# Patient Record
Sex: Female | Born: 1970 | Race: Black or African American | Hispanic: No | Smoking: Never smoker
Health system: Southern US, Community
[De-identification: ages and names within clinical notes are randomized; demographics above are authoritative.]

## PROBLEM LIST (undated history)

## (undated) DIAGNOSIS — R920 Mammographic microcalcification found on diagnostic imaging of breast: Secondary | ICD-10-CM

## (undated) DIAGNOSIS — K638219 Small intestinal bacterial overgrowth, unspecified: Secondary | ICD-10-CM

## (undated) HISTORY — DX: Small intestinal bacterial overgrowth, unspecified: K63.8219

## (undated) HISTORY — DX: Mammographic microcalcification found on diagnostic imaging of breast: R92.0

## (undated) HISTORY — PX: NO PAST SURGERIES: SHX2092

---

## 2009-09-12 LAB — HM PAP SMEAR

## 2010-08-17 ENCOUNTER — Ambulatory Visit: Payer: Self-pay

## 2011-07-09 HISTORY — PX: BREAST BIOPSY: SHX20

## 2012-04-23 ENCOUNTER — Ambulatory Visit: Payer: Self-pay

## 2012-04-30 ENCOUNTER — Ambulatory Visit: Payer: Self-pay

## 2012-05-18 ENCOUNTER — Ambulatory Visit: Payer: Self-pay | Admitting: General Surgery

## 2012-05-19 LAB — PATHOLOGY REPORT

## 2012-10-05 ENCOUNTER — Encounter: Payer: Self-pay | Admitting: General Surgery

## 2012-11-04 ENCOUNTER — Encounter: Payer: Self-pay | Admitting: *Deleted

## 2012-11-17 ENCOUNTER — Ambulatory Visit: Payer: Self-pay | Admitting: General Surgery

## 2012-11-18 ENCOUNTER — Encounter: Payer: Self-pay | Admitting: General Surgery

## 2012-12-08 ENCOUNTER — Encounter: Payer: Self-pay | Admitting: General Surgery

## 2012-12-08 ENCOUNTER — Ambulatory Visit (INDEPENDENT_AMBULATORY_CARE_PROVIDER_SITE_OTHER): Payer: 59 | Admitting: General Surgery

## 2012-12-08 VITALS — BP 120/80 | HR 78 | Resp 12 | Ht 65.0 in | Wt 197.0 lb

## 2012-12-08 DIAGNOSIS — Z803 Family history of malignant neoplasm of breast: Secondary | ICD-10-CM

## 2012-12-08 DIAGNOSIS — Z1239 Encounter for other screening for malignant neoplasm of breast: Secondary | ICD-10-CM

## 2012-12-08 NOTE — Patient Instructions (Addendum)
Return in 5 months.

## 2012-12-08 NOTE — Progress Notes (Signed)
Patient ID: Jenna Fuller, female   DOB: 03/06/1971, 42 y.o.   MRN: [id number]  Chief Complaint  Patient presents with  . Other    mammo    HPI Jenna Fuller is a 42 y.o. female here today with her follow up left mammogram done on 11-17-12 cat 2.In November 2013 she had stereo biopsy of the left breast for micro calcifications-benign pathology.  Patient states no new breast problems.Mother had breast ca at 67.  HPI  Past Medical History  Diagnosis Date  . Mammographic microcalcification     History reviewed. No pertinent past surgical history.  Family History  Problem Relation Age of Onset  . Breast cancer Mother 25    Social History History  Substance Use Topics  . Smoking status: Never Smoker   . Smokeless tobacco: Never Used  . Alcohol Use: No    No Known Allergies  No current outpatient prescriptions on file.   No current facility-administered medications for this visit.    Review of Systems Review of Systems  Constitutional: Negative.   Respiratory: Negative.   Cardiovascular: Negative.     Blood pressure 120/80, pulse 78, resp. rate 12, height 5\' 5"  (1.651 m), weight 197 lb (89.359 kg), last menstrual period 12/03/2012.  Physical Exam Physical Exam  Vitals reviewed. Constitutional: She appears well-developed and well-nourished.  Neck: Neck supple.  Pulmonary/Chest: Right breast exhibits no inverted nipple, no mass, no nipple discharge, no skin change and no tenderness. Left breast exhibits no inverted nipple, no mass, no nipple discharge, no skin change and no tenderness. Breasts are symmetrical.  Lymphadenopathy:    She has no cervical adenopathy.    She has no axillary adenopathy.    Data Reviewed Mammogram reviewed.  Assessment    Stable exam post stereo biopsy left side    Plan   Bil mammogram in 6 mos        SANKAR,SEEPLAPUTHUR G 12/10/2012, 8:15 AM

## 2012-12-10 ENCOUNTER — Encounter: Payer: Self-pay | Admitting: General Surgery

## 2012-12-10 DIAGNOSIS — Z1239 Encounter for other screening for malignant neoplasm of breast: Secondary | ICD-10-CM | POA: Insufficient documentation

## 2012-12-10 DIAGNOSIS — Z803 Family history of malignant neoplasm of breast: Secondary | ICD-10-CM | POA: Insufficient documentation

## 2013-05-13 ENCOUNTER — Ambulatory Visit: Payer: Self-pay | Admitting: General Surgery

## 2013-05-17 ENCOUNTER — Encounter: Payer: Self-pay | Admitting: General Surgery

## 2013-06-02 ENCOUNTER — Ambulatory Visit: Payer: 59 | Admitting: General Surgery

## 2013-07-14 ENCOUNTER — Encounter: Payer: Self-pay | Admitting: *Deleted

## 2014-04-27 LAB — HM PAP SMEAR
HM Pap smear: NEGATIVE
HM Pap smear: NEGATIVE
HM Pap smear: NEGATIVE

## 2014-05-09 ENCOUNTER — Encounter: Payer: Self-pay | Admitting: General Surgery

## 2014-05-30 ENCOUNTER — Ambulatory Visit: Payer: Self-pay

## 2014-05-30 LAB — HM MAMMOGRAPHY

## 2015-05-03 ENCOUNTER — Other Ambulatory Visit: Payer: Self-pay | Admitting: Obstetrics and Gynecology

## 2015-05-03 DIAGNOSIS — Z1231 Encounter for screening mammogram for malignant neoplasm of breast: Secondary | ICD-10-CM

## 2015-06-05 ENCOUNTER — Ambulatory Visit
Admission: RE | Admit: 2015-06-05 | Discharge: 2015-06-05 | Disposition: A | Discharge status: Home / Self Care | Payer: 59 | Source: Ambulatory Visit | Attending: Obstetrics and Gynecology | Admitting: Obstetrics and Gynecology

## 2015-06-05 DIAGNOSIS — Z1231 Encounter for screening mammogram for malignant neoplasm of breast: Secondary | ICD-10-CM | POA: Diagnosis present

## 2015-06-05 DIAGNOSIS — Z803 Family history of malignant neoplasm of breast: Secondary | ICD-10-CM | POA: Diagnosis not present

## 2015-07-26 ENCOUNTER — Encounter: Payer: Self-pay | Admitting: Unknown Physician Specialty

## 2015-07-26 ENCOUNTER — Other Ambulatory Visit: Payer: Self-pay | Admitting: Unknown Physician Specialty

## 2015-07-26 ENCOUNTER — Ambulatory Visit (INDEPENDENT_AMBULATORY_CARE_PROVIDER_SITE_OTHER): Payer: 59 | Admitting: Unknown Physician Specialty

## 2015-07-26 VITALS — BP 127/79 | HR 83 | Temp 98.2°F | Ht 66.0 in | Wt 197.0 lb

## 2015-07-26 DIAGNOSIS — N951 Menopausal and female climacteric states: Secondary | ICD-10-CM | POA: Diagnosis not present

## 2015-07-26 DIAGNOSIS — J309 Allergic rhinitis, unspecified: Secondary | ICD-10-CM

## 2015-07-26 DIAGNOSIS — D649 Anemia, unspecified: Secondary | ICD-10-CM | POA: Diagnosis not present

## 2015-07-26 DIAGNOSIS — Z780 Asymptomatic menopausal state: Secondary | ICD-10-CM | POA: Diagnosis not present

## 2015-07-26 DIAGNOSIS — N912 Amenorrhea, unspecified: Secondary | ICD-10-CM

## 2015-07-26 MED ORDER — PAROXETINE HCL 10 MG PO TABS: 10.0000 mg | ORAL_TABLET | Freq: Every day | ORAL | Status: DC

## 2015-07-26 NOTE — Assessment & Plan Note (Signed)
Hot flashes that appear to be due to menopause despite young age.  Check FSH and LH.  Start Paxil 5 mg as high risk for hormone therapy with mother's history of breast cancer.

## 2015-07-26 NOTE — Addendum Note (Signed)
Addended by: Gabriel Cirri on: 07/26/2015 02:39 PM   Modules accepted: Orders, SmartSet

## 2015-07-26 NOTE — Progress Notes (Signed)
BP 127/79 mmHg  Pulse 83  Temp(Src) 98.2 F (36.8 C)  Ht  (1.676 m)  Wt 197 lb (89.359 kg)  BMI 31.81 kg/m2  SpO2 98%  LMP 04/18/2015 (Approximate)   Subjective:    Patient ID: Jenna Fuller, female    DOB: Mar 13, 1971, 45 y.o.   MRN: [id number]  HPI: Jenna Fuller is a 45 y.o. female  Chief Complaint  Patient presents with  . Annual Exam    pt states she would like to talk about menstrual cycle. States she has not had one since 04/2015 and started having hot flashes   She states she went to her CNM and did not get a pap smear due to 3 year cycle but did get lab work done.  CC is no menses since October.  She is having hot flashes and complaining of hourly flashes that last about 2 minutes.  No chance she is pregnant.    Family history significant for breast cancer in mother and aunt  Labs reviewed from gyn and all normal besides mild anemia.  TSH is 1.5  Relevant past medical, surgical, family and social history reviewed and updated as indicated. Interim medical history since our last visit reviewed. Allergies and medications reviewed and updated.  Review of Systems  Per HPI unless specifically indicated above     Objective:    BP 127/79 mmHg  Pulse 83  Temp(Src) 98.2 F (36.8 C)  Ht  (1.676 m)  Wt 197 lb (89.359 kg)  BMI 31.81 kg/m2  SpO2 98%  LMP 04/18/2015 (Approximate)  Wt Readings from Last 3 Encounters:  07/26/15 197 lb (89.359 kg)  12/08/12 197 lb (89.359 kg)    Physical Exam  Constitutional: She is oriented to person, place, and time. She appears well-developed and well-nourished. No distress.  HENT:  Head: Normocephalic and atraumatic.  Eyes: Conjunctivae and lids are normal. Right eye exhibits no discharge. Left eye exhibits no discharge. No scleral icterus.  Neck: Normal range of motion. Neck supple. No JVD present. Carotid bruit is not present.  Cardiovascular: Normal rate, regular rhythm and normal heart sounds.    Pulmonary/Chest: Effort normal and breath sounds normal.  Abdominal: Normal appearance. There is no splenomegaly or hepatomegaly.  Musculoskeletal: Normal range of motion.  Neurological: She is alert and oriented to person, place, and time.  Skin: Skin is warm, dry and intact. No rash noted. No pallor.  Psychiatric: She has a normal mood and affect. Her behavior is normal. Judgment and thought content normal.    Results for orders placed or performed in visit on 07/26/15  HM MAMMOGRAPHY  Result Value Ref Range   HM Mammogram from PP   HM PAP SMEAR  Result Value Ref Range   HM Pap smear from PP       Assessment & Plan:   Problem List Items Addressed This Visit      Unprioritized   Menopause - Primary   Hot flashes, menopausal    Hot flashes that appear to be due to menopause despite young age.  Check FSH and LH.  Start Paxil 5 mg as high risk for hormone therapy with mother's history of breast cancer.         Other Visit Diagnoses    Amenorrhea        Relevant Orders    FSH    LH    Prolactin        Follow up plan: Return in about 6 weeks (  around 09/06/2015).

## 2015-07-27 ENCOUNTER — Encounter: Payer: Self-pay | Admitting: Unknown Physician Specialty

## 2015-07-27 LAB — CBC WITH DIFFERENTIAL/PLATELET
Basophils Absolute: 0 10*3/uL (ref 0.0–0.2)
Basos: 0 %
EOS (ABSOLUTE): 0.2 10*3/uL (ref 0.0–0.4)
Eos: 4 %
Hematocrit: 38.1 % (ref 34.0–46.6)
Hemoglobin: 12 g/dL (ref 11.1–15.9)
Immature Grans (Abs): 0 10*3/uL (ref 0.0–0.1)
Immature Granulocytes: 0 %
Lymphocytes Absolute: 1.4 10*3/uL (ref 0.7–3.1)
Lymphs: 30 %
MCH: 23 pg — ABNORMAL LOW (ref 26.6–33.0)
MCHC: 31.5 g/dL (ref 31.5–35.7)
MCV: 73 fL — ABNORMAL LOW (ref 79–97)
Monocytes Absolute: 0.6 10*3/uL (ref 0.1–0.9)
Monocytes: 13 %
Neutrophils Absolute: 2.4 10*3/uL (ref 1.4–7.0)
Neutrophils: 53 %
Platelets: 274 10*3/uL (ref 150–379)
RBC: 5.21 x10E6/uL (ref 3.77–5.28)
RDW: 20.8 % — ABNORMAL HIGH (ref 12.3–15.4)
WBC: 4.6 10*3/uL (ref 3.4–10.8)

## 2015-07-27 LAB — PROLACTIN: Prolactin: 8.9 ng/mL (ref 4.8–23.3)

## 2015-07-27 LAB — FOLLICLE STIMULATING HORMONE: FSH: 38.5 m[IU]/mL

## 2015-07-27 LAB — LUTEINIZING HORMONE: LH: 26.3 m[IU]/mL

## 2015-09-06 ENCOUNTER — Encounter: Payer: Self-pay | Admitting: Unknown Physician Specialty

## 2015-09-06 ENCOUNTER — Ambulatory Visit (INDEPENDENT_AMBULATORY_CARE_PROVIDER_SITE_OTHER): Payer: 59 | Admitting: Unknown Physician Specialty

## 2015-09-06 VITALS — BP 127/75 | HR 80 | Temp 98.1°F | Ht 65.8 in | Wt 204.8 lb

## 2015-09-06 DIAGNOSIS — N951 Menopausal and female climacteric states: Secondary | ICD-10-CM

## 2015-09-06 NOTE — Progress Notes (Signed)
BP 127/75 mmHg  Pulse 80  Temp(Src) 98.1 F (36.7 C)  Ht 5' 5.8" (1.671 m)  Wt 204 lb 12.8 oz (92.897 kg)  BMI 33.27 kg/m2  SpO2 99%  LMP 04/19/2015 (Approximate)   Subjective:    Patient ID: Jenna Fuller, female    DOB: 08-11-1970, 45 y.o.   MRN: [id number]  HPI: Jenna Fuller is a 45 y.o. female  Chief Complaint  Patient presents with  . Hot Flashes    pt states she is still having hot flashes but states they are different. States they stopped for about 3 weeks and when they came back, they were different.    Hot flashes: Patient hasn't noticed improvement in symptoms on Paxil. States pills are small as it is and very difficult to break in half in order to get  dose. Flashes actually stopped about a week before she was last here in January and did not return until 3 weeks later. Flashes feeling differently now and patient "just suddenly feels hot" whereas before, the flashes "would start in her chest and progress up neck to face." Flashes are disruptive to sleep at night and waking up 2-4 times every night.   Relevant past medical, surgical, family and social history reviewed and updated as indicated. Interim medical history since our last visit reviewed. Allergies and medications reviewed and updated.  Review of Systems  Constitutional: Negative.   HENT: Negative.   Eyes: Negative.   Respiratory: Negative.   Cardiovascular: Negative.   Gastrointestinal: Negative.   Endocrine: Negative.        Hot flashes  Genitourinary: Negative.   Musculoskeletal: Negative.   Skin: Negative.   Allergic/Immunologic: Negative.   Neurological: Negative.   Hematological: Negative.   Psychiatric/Behavioral: Negative.     Per HPI unless specifically indicated above     Objective:    BP 127/75 mmHg  Pulse 80  Temp(Src) 98.1 F (36.7 C)  Ht 5' 5.8" (1.671 m)  Wt 204 lb 12.8 oz (92.897 kg)  BMI 33.27 kg/m2  SpO2 99%  LMP 04/19/2015 (Approximate)  Wt  Readings from Last 3 Encounters:  09/06/15 204 lb 12.8 oz (92.897 kg)  07/26/15 197 lb (89.359 kg)  12/08/12 197 lb (89.359 kg)    Physical Exam  Constitutional: She is oriented to person, place, and time. She appears well-developed and well-nourished. No distress.  HENT:  Head: Normocephalic and atraumatic.  Eyes: Conjunctivae and lids are normal. Right eye exhibits no discharge. Left eye exhibits no discharge. No scleral icterus.  Cardiovascular: Normal rate.   Pulmonary/Chest: Effort normal.  Abdominal: Normal appearance. There is no splenomegaly or hepatomegaly.  Musculoskeletal: Normal range of motion.  Neurological: She is alert and oriented to person, place, and time.  Skin: Skin is intact. No rash noted. No pallor.  Psychiatric: She has a normal mood and affect. Her behavior is normal. Judgment and thought content normal.    Results for orders placed or performed in visit on 07/26/15  [facility]  Result Value Ref Range   FSH 38.5 mIU/mL  LH  Result Value Ref Range   LH 26.3 mIU/mL  Prolactin  Result Value Ref Range   Prolactin 8.9 4.8 - 23.3 ng/mL  CBC with Differential/Platelet  Result Value Ref Range   WBC 4.6 3.4 - 10.8 x10E3/uL   RBC 5.21 3.77 - 5.28 x10E6/uL   Hemoglobin 12.0 11.1 - 15.9 g/dL   Hematocrit 40.9 81.1 - 46.6 %   MCV 73 (L) 79 - 97  fL   MCH 23.0 (L) 26.6 - 33.0 pg   MCHC 31.5 31.5 - 35.7 g/dL   RDW 45.4 (H) 09.8 - 11.9 %   Platelets 274 150 - 379 x10E3/uL   Neutrophils 53 %   Lymphs 30 %   Monocytes 13 %   Eos 4 %   Basos 0 %   Neutrophils Absolute 2.4 1.4 - 7.0 x10E3/uL   Lymphocytes Absolute 1.4 0.7 - 3.1 x10E3/uL   Monocytes Absolute 0.6 0.1 - 0.9 x10E3/uL   EOS (ABSOLUTE) 0.2 0.0 - 0.4 x10E3/uL   Basophils Absolute 0.0 0.0 - 0.2 x10E3/uL   Immature Granulocytes 0 %   Immature Grans (Abs) 0.0 0.0 - 0.1 x10E3/uL  CBC with Differential/Platelet  Result Value Ref Range   WBC CANCELED x10E3/uL   RBC CANCELED    Hemoglobin CANCELED     Hematocrit CANCELED    Platelets CANCELED    Neutrophils CANCELED    Lymphs CANCELED    Monocytes CANCELED    Eos CANCELED    Lymphocytes Absolute CANCELED    EOS (ABSOLUTE) CANCELED    Basophils Absolute CANCELED       Assessment & Plan:   Problem List Items Addressed This Visit      Unprioritized   Hot flashes, menopausal - Primary    No improvement in flashes on Paxil . Increase to Paxil .           Follow up plan: Return in about 6 weeks (around 10/18/2015).

## 2015-09-06 NOTE — Assessment & Plan Note (Signed)
No improvement in flashes on Paxil . Increase to Paxil .

## 2015-09-14 ENCOUNTER — Telehealth: Payer: Self-pay | Admitting: Unknown Physician Specialty

## 2015-09-14 MED ORDER — CITALOPRAM HYDROBROMIDE 20 MG PO TABS: 20.0000 mg | ORAL_TABLET | Freq: Every day | ORAL | Status: DC

## 2015-09-14 NOTE — Telephone Encounter (Signed)
Routing to provider. Can we send in something different? She was just seen 09/06/15.

## 2015-09-14 NOTE — Telephone Encounter (Signed)
We can change to Citalopram.

## 2015-09-14 NOTE — Telephone Encounter (Signed)
Pt called and she is has been experiencing numbness in her legs since she started taking PARoxetine (PAXIL) 10 MG tablet.

## 2015-09-14 NOTE — Telephone Encounter (Signed)
Left message on patients voicemail about the medication being switched. Told her to call us with any questions.

## 2015-09-21 ENCOUNTER — Telehealth: Payer: Self-pay | Admitting: Unknown Physician Specialty

## 2015-09-21 NOTE — Telephone Encounter (Signed)
Pt still having numbness in R leg at night and now is experiencing tingling during the day.  Medicine is not helping, would like someone to call her as to what to do next.

## 2015-09-22 NOTE — Telephone Encounter (Signed)
Routing to provider. Patient was on paxil and Elnita MaxwellCheryl switched her to citalopram because patient thought that paxil was possibly causing the numbness. Should we have the patient come in for an appointment with Elnita Maxwellheryl? What can I advise her to do over the weekend?

## 2015-09-22 NOTE — Telephone Encounter (Signed)
Yes, please. Let's get her into see Elnita MaxwellCheryl next week.

## 2015-09-22 NOTE — Telephone Encounter (Signed)
Called and scheduled patient an appointment for 09/27/15.

## 2015-09-27 ENCOUNTER — Ambulatory Visit (INDEPENDENT_AMBULATORY_CARE_PROVIDER_SITE_OTHER): Payer: 59 | Admitting: Unknown Physician Specialty

## 2015-09-27 ENCOUNTER — Encounter: Payer: Self-pay | Admitting: Unknown Physician Specialty

## 2015-09-27 VITALS — BP 129/80 | HR 68 | Temp 97.9°F | Ht 65.5 in | Wt 206.2 lb

## 2015-09-27 DIAGNOSIS — G5711 Meralgia paresthetica, right lower limb: Secondary | ICD-10-CM | POA: Insufficient documentation

## 2015-09-27 DIAGNOSIS — D509 Iron deficiency anemia, unspecified: Secondary | ICD-10-CM

## 2015-09-27 NOTE — Progress Notes (Signed)
BP 129/80 mmHg  Pulse 68  Temp(Src) 97.9 F (36.6 C)  Ht 5' 5.5" (1.664 m)  Wt 206 lb 3.2 oz (93.532 kg)  BMI 33.78 kg/m2  SpO2 100%  LMP 04/19/2015 (Approximate)   Subjective:    Patient ID: Jenna Fuller, female    DOB: 1970/08/14, 45 y.o.   MRN: [id number]  HPI: [name] is a 45 y.o. female  Chief Complaint  Patient presents with  . Numbness    pt states she has been having numbness and tingling in right thigh, thought it may have been from a medication she started (paxil) but she stopped taking that and she is still having the numbness and tingling   Pt is taking the Citalopram.  She is hot flash free but started noting the above symptoms.  States it is there when lying on her back, crossing her legs, or lying on right side but not on left side.  She states it is more like a lack of feeling than pins and needles and more of a heavy feeling.  No problems moving legs.    Relevant past medical, surgical, family and social history reviewed and updated as indicated. Interim medical history since our last visit reviewed. Allergies and medications reviewed and updated.  Review of Systems  All other systems reviewed and are negative.   Per HPI unless specifically indicated above     Objective:    BP 129/80 mmHg  Pulse 68  Temp(Src) 97.9 F (36.6 C)  Ht 5' 5.5" (1.664 m)  Wt 206 lb 3.2 oz (93.532 kg)  BMI 33.78 kg/m2  SpO2 100%  LMP 04/19/2015 (Approximate)  Wt Readings from Last 3 Encounters:  09/27/15 206 lb 3.2 oz (93.532 kg)  09/06/15 204 lb 12.8 oz (92.897 kg)  07/26/15 197 lb (89.359 kg)    Physical Exam  Constitutional: She is oriented to person, place, and time. She appears well-developed and well-nourished. No distress.  HENT:  Head: Normocephalic and atraumatic.  Eyes: Conjunctivae and lids are normal. Right eye exhibits no discharge. Left eye exhibits no discharge. No scleral icterus.  Cardiovascular: Normal rate.    Pulmonary/Chest: Effort normal.  Abdominal: Normal appearance. There is no splenomegaly or hepatomegaly.  Musculoskeletal: Normal range of motion. She exhibits no edema or tenderness.       Right upper leg: She exhibits no tenderness, no bony tenderness, no swelling, no edema, no deformity and no laceration.  Sensation intact.    Neurological: She is alert and oriented to person, place, and time.  Skin: Skin is intact. No rash noted. No pallor.  Psychiatric: She has a normal mood and affect. Her behavior is normal. Judgment and thought content normal.   Noted microcytosis below  Results for orders placed or performed in visit on 07/26/15  [facility]  Result Value Ref Range   FSH 38.5 mIU/mL  LH  Result Value Ref Range   LH 26.3 mIU/mL  Prolactin  Result Value Ref Range   Prolactin 8.9 4.8 - 23.3 ng/mL  CBC with Differential/Platelet  Result Value Ref Range   WBC 4.6 3.4 - 10.8 x10E3/uL   RBC 5.21 3.77 - 5.28 x10E6/uL   Hemoglobin 12.0 11.1 - 15.9 g/dL   Hematocrit 40.9 81.1 - 46.6 %   MCV 73 (L) 79 - 97 fL   MCH 23.0 (L) 26.6 - 33.0 pg   MCHC 31.5 31.5 - 35.7 g/dL   RDW 91.4 (H) 78.2 - 95.6 %   Platelets 274 150 -  379 x10E3/uL   Neutrophils 53 %   Lymphs 30 %   Monocytes 13 %   Eos 4 %   Basos 0 %   Neutrophils Absolute 2.4 1.4 - 7.0 x10E3/uL   Lymphocytes Absolute 1.4 0.7 - 3.1 x10E3/uL   Monocytes Absolute 0.6 0.1 - 0.9 x10E3/uL   EOS (ABSOLUTE) 0.2 0.0 - 0.4 x10E3/uL   Basophils Absolute 0.0 0.0 - 0.2 x10E3/uL   Immature Granulocytes 0 %   Immature Grans (Abs) 0.0 0.0 - 0.1 x10E3/uL  CBC with Differential/Platelet  Result Value Ref Range   WBC CANCELED x10E3/uL   RBC CANCELED    Hemoglobin CANCELED    Hematocrit CANCELED    Platelets CANCELED    Neutrophils CANCELED    Lymphs CANCELED    Monocytes CANCELED    Eos CANCELED    Lymphocytes Absolute CANCELED    EOS (ABSOLUTE) CANCELED    Basophils Absolute CANCELED       Assessment & Plan:   Problem List Items  Addressed This Visit      Unprioritized   Meralgia paresthetica of right side - Primary   Relevant Orders   Vitamin B12   Comprehensive metabolic panel    Other Visit Diagnoses    Microcytic anemia        Relevant Orders    Folate    Iron and TIBC    Ferritin    DG Lumbar Spine Complete    Comprehensive metabolic panel        Follow up plan: Follow up results

## 2015-09-28 ENCOUNTER — Encounter: Payer: Self-pay | Admitting: Unknown Physician Specialty

## 2015-09-28 LAB — COMPREHENSIVE METABOLIC PANEL
ALT: 8 IU/L (ref 0–32)
AST: 18 IU/L (ref 0–40)
Albumin/Globulin Ratio: 1.4 (ref 1.2–2.2)
Albumin: 4.4 g/dL (ref 3.5–5.5)
Alkaline Phosphatase: 63 IU/L (ref 39–117)
BUN/Creatinine Ratio: 17 (ref 9–23)
BUN: 16 mg/dL (ref 6–24)
Bilirubin Total: 0.5 mg/dL (ref 0.0–1.2)
CO2: 25 mmol/L (ref 18–29)
Calcium: 9.6 mg/dL (ref 8.7–10.2)
Chloride: 99 mmol/L (ref 96–106)
Creatinine, Ser: 0.96 mg/dL (ref 0.57–1.00)
GFR calc Af Amer: 84 mL/min/{1.73_m2} (ref >59)
GFR calc non Af Amer: 73 mL/min/{1.73_m2} (ref >59)
Globulin, Total: 3.1 g/dL (ref 1.5–4.5)
Glucose: 81 mg/dL (ref 65–99)
Potassium: 4.6 mmol/L (ref 3.5–5.2)
Sodium: 139 mmol/L (ref 134–144)
Total Protein: 7.5 g/dL (ref 6.0–8.5)

## 2015-09-28 LAB — VITAMIN B12: Vitamin B-12: 1410 pg/mL — ABNORMAL HIGH (ref 211–946)

## 2015-09-28 LAB — IRON AND TIBC
Iron Saturation: 41 % (ref 15–55)
Iron: 148 ug/dL (ref 27–159)
Total Iron Binding Capacity: 358 ug/dL (ref 250–450)
UIBC: 210 ug/dL (ref 131–425)

## 2015-09-28 LAB — FOLATE: Folate: 14.7 ng/mL (ref >3.0)

## 2015-09-28 LAB — FERRITIN: Ferritin: 14 ng/mL — ABNORMAL LOW (ref 15–150)

## 2015-10-18 ENCOUNTER — Other Ambulatory Visit: Payer: Self-pay

## 2015-10-18 ENCOUNTER — Other Ambulatory Visit: Payer: 59

## 2015-10-18 ENCOUNTER — Ambulatory Visit: Admission: RE | Admit: 2015-10-18 | Payer: 59 | Source: Ambulatory Visit | Admitting: *Deleted

## 2015-10-18 DIAGNOSIS — M545 Low back pain, unspecified: Secondary | ICD-10-CM

## 2015-10-18 LAB — PREGNANCY, URINE: Preg Test, Ur: NEGATIVE

## 2015-10-19 ENCOUNTER — Ambulatory Visit
Admission: RE | Admit: 2015-10-19 | Discharge: 2015-10-19 | Disposition: A | Discharge status: Home / Self Care | Payer: 59 | Source: Ambulatory Visit | Attending: Unknown Physician Specialty | Admitting: Unknown Physician Specialty

## 2015-10-19 DIAGNOSIS — M545 Low back pain, unspecified: Secondary | ICD-10-CM

## 2015-10-24 ENCOUNTER — Encounter: Payer: Self-pay | Admitting: Unknown Physician Specialty

## 2015-10-24 ENCOUNTER — Ambulatory Visit (INDEPENDENT_AMBULATORY_CARE_PROVIDER_SITE_OTHER): Payer: 59 | Admitting: Unknown Physician Specialty

## 2015-10-24 VITALS — BP 129/77 | HR 84 | Temp 98.2°F | Ht 65.3 in | Wt 206.8 lb

## 2015-10-24 DIAGNOSIS — G5711 Meralgia paresthetica, right lower limb: Secondary | ICD-10-CM

## 2015-10-24 DIAGNOSIS — N951 Menopausal and female climacteric states: Secondary | ICD-10-CM | POA: Diagnosis not present

## 2015-10-24 NOTE — Progress Notes (Signed)
   BP 129/77 mmHg  Pulse 84  Temp(Src) 98.2 F (36.8 C)  Ht 5' 5.3" (1.659 m)  Wt 206 lb 12.8 oz (93.804 kg)  BMI 34.08 kg/m2  SpO2 100%   Subjective:    Patient ID: Jenna Fuller, female    DOB: [birth year], 45 y.o.   MRN: [id number]  HPI: Jenna Berlinershell Jones Parrow is a 45 y.o. female  Chief Complaint  Patient presents with  . Hot Flashes    6 week f/u for hot flashes   Pt states she still has them but "their not as bad as they were."  Never did take the Citalopram.  She feels like she is OK if they stay the way they have.    Still having numbness on the front and back of leg when she is on her stomach and her back.   She does have tightness in her back when she walks.  She did start some yoga.    Relevant past medical, surgical, family and social history reviewed and updated as indicated. Interim medical history since our last visit reviewed. Allergies and medications reviewed and updated.  Review of Systems  Per HPI unless specifically indicated above     Objective:    BP 129/77 mmHg  Pulse 84  Temp(Src) 98.2 F (36.8 C)  Ht 5' 5.3" (1.659 m)  Wt 206 lb 12.8 oz (93.804 kg)  BMI 34.08 kg/m2  SpO2 100%  Wt Readings from Last 3 Encounters:  10/24/15 206 lb 12.8 oz (93.804 kg)  09/27/15 206 lb 3.2 oz (93.532 kg)  09/06/15 204 lb 12.8 oz (92.897 kg)    Physical Exam  Constitutional: She is oriented to person, place, and time. She appears well-developed and well-nourished. No distress.  HENT:  Head: Normocephalic and atraumatic.  Eyes: Conjunctivae and lids are normal. Right eye exhibits no discharge. Left eye exhibits no discharge. No scleral icterus.  Cardiovascular: Normal rate.   Pulmonary/Chest: Effort normal.  Abdominal: Normal appearance. There is no splenomegaly or hepatomegaly.  Musculoskeletal: Normal range of motion.  Neurological: She is alert and oriented to person, place, and time.  Skin: Skin is intact. No rash noted. No pallor.   Psychiatric: She has a normal mood and affect. Her behavior is normal. Judgment and thought content normal.    Results for orders placed or performed in visit on 10/18/15  Pregnancy, urine  Result Value Ref Range   Preg Test, Ur Negative Negative      Assessment & Plan:   Problem List Items Addressed This Visit      Unprioritized   Hot flashes, menopausal - Primary    Better, not wanting to take medications now.  Will follow      Meralgia paresthetica of right side    This seems to be stable and no worse.  Pt will work on exercises for back and see if it resolves.  X-ray without any acute change.           Follow up plan: Return in about 6 months (around 04/24/2016), or if symptoms worsen or fail to improve.

## 2015-10-24 NOTE — Assessment & Plan Note (Signed)
Better, not wanting to take medications now.  Will follow

## 2015-10-24 NOTE — Assessment & Plan Note (Signed)
This seems to be stable and no worse.  Pt will work on exercises for back and see if it resolves.  X-ray without any acute change.

## 2016-04-24 ENCOUNTER — Ambulatory Visit: Payer: 59 | Admitting: Unknown Physician Specialty

## 2016-07-30 ENCOUNTER — Ambulatory Visit (INDEPENDENT_AMBULATORY_CARE_PROVIDER_SITE_OTHER): Payer: 59 | Admitting: Unknown Physician Specialty

## 2016-07-30 ENCOUNTER — Encounter: Payer: Self-pay | Admitting: Unknown Physician Specialty

## 2016-07-30 VITALS — BP 113/70 | HR 80 | Temp 97.5°F | Ht 65.2 in | Wt 172.6 lb

## 2016-07-30 DIAGNOSIS — Z1231 Encounter for screening mammogram for malignant neoplasm of breast: Secondary | ICD-10-CM

## 2016-07-30 DIAGNOSIS — Z0000 Encounter for general adult medical examination without abnormal findings: Secondary | ICD-10-CM | POA: Diagnosis not present

## 2016-07-30 DIAGNOSIS — Z1239 Encounter for other screening for malignant neoplasm of breast: Secondary | ICD-10-CM

## 2016-07-30 NOTE — Progress Notes (Signed)
   BP 113/70 (BP Location: Left Arm, Patient Position: Sitting, Cuff Size: Large)   Pulse 80   Temp 97.5 F (36.4 C)   Ht 5' 5.2" (1.656 m)   Wt 172 lb 9.6 oz (78.3 kg)   LMP 04/15/2016 (Approximate)   SpO2 97%   BMI 28.55 kg/m    Subjective:    Patient ID: Jenna Fuller, female    DOB: [birth year], 46 y.o.   MRN: [id number]  HPI: Jenna Fuller is a 46 y.o. female  Chief Complaint  Patient presents with  . Annual Exam    pt states it we can order the HIV test   Pt here for general history and physical.  She has no complaints today  Relevant past medical, surgical, family and social history reviewed and updated as indicated. Interim medical history since our last visit reviewed. Allergies and medications reviewed and updated.  Review of Systems  Per HPI unless specifically indicated above     Objective:    BP 113/70 (BP Location: Left Arm, Patient Position: Sitting, Cuff Size: Large)   Pulse 80   Temp 97.5 F (36.4 C)   Ht 5' 5.2" (1.656 m)   Wt 172 lb 9.6 oz (78.3 kg)   LMP 04/15/2016 (Approximate)   SpO2 97%   BMI 28.55 kg/m   Wt Readings from Last 3 Encounters:  07/30/16 172 lb 9.6 oz (78.3 kg)  10/24/15 206 lb 12.8 oz (93.8 kg)  09/27/15 206 lb 3.2 oz (93.5 kg)    Physical Exam  Constitutional: She is oriented to person, place, and time. She appears well-developed and well-nourished.  HENT:  Head: Normocephalic and atraumatic.  Eyes: Pupils are equal, round, and reactive to light. Right eye exhibits no discharge. Left eye exhibits no discharge. No scleral icterus.  Neck: Normal range of motion. Neck supple. Carotid bruit is not present. No thyromegaly present.  Cardiovascular: Normal rate, regular rhythm and normal heart sounds.  Exam reveals no gallop and no friction rub.   No murmur heard. Pulmonary/Chest: Effort normal and breath sounds normal. No respiratory distress. She has no wheezes. She has no rales.  Abdominal: Soft. Bowel  sounds are normal. There is no tenderness. There is no rebound.  Genitourinary: No breast swelling, tenderness or discharge.  Musculoskeletal: Normal range of motion.  Lymphadenopathy:    She has no cervical adenopathy.  Neurological: She is alert and oriented to person, place, and time.  Skin: Skin is warm, dry and intact. No rash noted.  Psychiatric: She has a normal mood and affect. Her speech is normal and behavior is normal. Judgment and thought content normal. Cognition and memory are normal.    Results for orders placed or performed in visit on 10/18/15  Pregnancy, urine  Result Value Ref Range   Preg Test, Ur Negative Negative      Assessment & Plan:   Problem List Items Addressed This Visit      Unprioritized   Breast screening    Other Visit Diagnoses    Routine general medical examination at a health care facility    -  Primary   Relevant Orders   Comprehensive metabolic panel   TSH   Lipid Panel w/o Chol/HDL Ratio   CBC with Differential/Platelet   MM DIGITAL SCREENING BILATERAL       Follow up plan: Return in about 1 year (around 07/30/2017).

## 2016-07-31 ENCOUNTER — Encounter: Payer: Self-pay | Admitting: Unknown Physician Specialty

## 2016-07-31 LAB — CBC WITH DIFFERENTIAL/PLATELET
Basophils Absolute: 0 10*3/uL (ref 0.0–0.2)
Basos: 0 %
EOS (ABSOLUTE): 0.2 10*3/uL (ref 0.0–0.4)
Eos: 4 %
Hematocrit: 39.7 % (ref 34.0–46.6)
Hemoglobin: 12.5 g/dL (ref 11.1–15.9)
Immature Grans (Abs): 0 10*3/uL (ref 0.0–0.1)
Immature Granulocytes: 0 %
Lymphocytes Absolute: 1.9 10*3/uL (ref 0.7–3.1)
Lymphs: 34 %
MCH: 24.5 pg — ABNORMAL LOW (ref 26.6–33.0)
MCHC: 31.5 g/dL (ref 31.5–35.7)
MCV: 78 fL — ABNORMAL LOW (ref 79–97)
Monocytes Absolute: 0.4 10*3/uL (ref 0.1–0.9)
Monocytes: 8 %
Neutrophils Absolute: 2.9 10*3/uL (ref 1.4–7.0)
Neutrophils: 54 %
Platelets: 250 10*3/uL (ref 150–379)
RBC: 5.1 x10E6/uL (ref 3.77–5.28)
RDW: 16.5 % — ABNORMAL HIGH (ref 12.3–15.4)
WBC: 5.4 10*3/uL (ref 3.4–10.8)

## 2016-07-31 LAB — COMPREHENSIVE METABOLIC PANEL
ALT: 10 IU/L (ref 0–32)
AST: 15 IU/L (ref 0–40)
Albumin/Globulin Ratio: 1.6 (ref 1.2–2.2)
Albumin: 4.5 g/dL (ref 3.5–5.5)
Alkaline Phosphatase: 68 IU/L (ref 39–117)
BUN/Creatinine Ratio: 12 (ref 9–23)
BUN: 14 mg/dL (ref 6–24)
Bilirubin Total: 0.5 mg/dL (ref 0.0–1.2)
CO2: 25 mmol/L (ref 18–29)
Calcium: 9.7 mg/dL (ref 8.7–10.2)
Chloride: 101 mmol/L (ref 96–106)
Creatinine, Ser: 1.15 mg/dL — ABNORMAL HIGH (ref 0.57–1.00)
GFR calc Af Amer: 67 mL/min/{1.73_m2} (ref >59)
GFR calc non Af Amer: 58 mL/min/{1.73_m2} — ABNORMAL LOW (ref >59)
Globulin, Total: 2.9 g/dL (ref 1.5–4.5)
Glucose: 67 mg/dL (ref 65–99)
Potassium: 4.5 mmol/L (ref 3.5–5.2)
Sodium: 142 mmol/L (ref 134–144)
Total Protein: 7.4 g/dL (ref 6.0–8.5)

## 2016-07-31 LAB — LIPID PANEL W/O CHOL/HDL RATIO
Cholesterol, Total: 227 mg/dL — ABNORMAL HIGH (ref 100–199)
HDL: 87 mg/dL (ref >39)
LDL Calculated: 131 mg/dL — ABNORMAL HIGH (ref 0–99)
Triglycerides: 45 mg/dL (ref 0–149)
VLDL Cholesterol Cal: 9 mg/dL (ref 5–40)

## 2016-07-31 LAB — TSH: TSH: 1.78 u[IU]/mL (ref 0.450–4.500)

## 2016-07-31 NOTE — Progress Notes (Signed)
Normal labs.  Patient notified by letter.

## 2016-10-08 ENCOUNTER — Ambulatory Visit
Admission: RE | Admit: 2016-10-08 | Discharge: 2016-10-08 | Disposition: A | Discharge status: Home / Self Care | Payer: 59 | Source: Ambulatory Visit | Attending: Unknown Physician Specialty | Admitting: Unknown Physician Specialty

## 2016-10-08 DIAGNOSIS — Z1231 Encounter for screening mammogram for malignant neoplasm of breast: Secondary | ICD-10-CM | POA: Insufficient documentation

## 2016-10-08 DIAGNOSIS — Z0000 Encounter for general adult medical examination without abnormal findings: Secondary | ICD-10-CM

## 2016-10-09 ENCOUNTER — Other Ambulatory Visit: Payer: Self-pay | Admitting: Unknown Physician Specialty

## 2016-10-09 DIAGNOSIS — R928 Other abnormal and inconclusive findings on diagnostic imaging of breast: Secondary | ICD-10-CM

## 2016-10-09 DIAGNOSIS — N6489 Other specified disorders of breast: Secondary | ICD-10-CM

## 2016-11-21 ENCOUNTER — Ambulatory Visit
Admission: RE | Admit: 2016-11-21 | Discharge: 2016-11-21 | Disposition: A | Discharge status: Home / Self Care | Payer: 59 | Source: Ambulatory Visit | Attending: Unknown Physician Specialty | Admitting: Unknown Physician Specialty

## 2016-11-21 ENCOUNTER — Encounter: Payer: Self-pay | Admitting: Family Medicine

## 2016-11-21 DIAGNOSIS — N6489 Other specified disorders of breast: Secondary | ICD-10-CM | POA: Diagnosis not present

## 2016-11-21 DIAGNOSIS — R928 Other abnormal and inconclusive findings on diagnostic imaging of breast: Secondary | ICD-10-CM

## 2017-02-12 ENCOUNTER — Ambulatory Visit (INDEPENDENT_AMBULATORY_CARE_PROVIDER_SITE_OTHER): Payer: 59 | Admitting: Unknown Physician Specialty

## 2017-02-12 DIAGNOSIS — J039 Acute tonsillitis, unspecified: Secondary | ICD-10-CM | POA: Diagnosis not present

## 2017-02-13 MED ORDER — AMOXICILLIN 875 MG PO TABS: 875.0000 mg | ORAL_TABLET | Freq: Two times a day (BID) | ORAL | 0 refills | Status: DC

## 2017-02-13 NOTE — Progress Notes (Signed)
There were no vitals taken for this visit.   Subjective:    Patient ID: Jenna Fuller, female    DOB: 07-08-1971, 46 y.o.   MRN: [id number]  HPI: Jenna Fuller is a 46 y.o. female  No chief complaint on file.  Sore Throat   This is a new problem. The current episode started yesterday. The problem has been rapidly worsening. The pain is worse on the left side. There has been no fever. The pain is severe. Associated symptoms include ear pain, a hoarse voice and neck pain. Pertinent negatives include no abdominal pain, congestion, coughing, diarrhea or headaches. She has had no exposure to strep or mono. She has tried nothing for the symptoms.     Relevant past medical, surgical, family and social history reviewed and updated as indicated. Interim medical history since our last visit reviewed. Allergies and medications reviewed and updated.  Review of Systems  HENT: Positive for ear pain and hoarse voice. Negative for congestion.   Respiratory: Negative for cough.   Gastrointestinal: Negative for abdominal pain and diarrhea.  Musculoskeletal: Positive for neck pain.  Neurological: Negative for headaches.    Per HPI unless specifically indicated above     Objective:    There were no vitals taken for this visit.  Wt Readings from Last 3 Encounters:  07/30/16 172 lb 9.6 oz (78.3 kg)  10/24/15 206 lb 12.8 oz (93.8 kg)  09/27/15 206 lb 3.2 oz (93.5 kg)    Physical Exam  Constitutional: She is oriented to person, place, and time. She appears well-developed and well-nourished. No distress.  HENT:  Head: Normocephalic and atraumatic.  Right Ear: Tympanic membrane and ear canal normal.  Left Ear: Tympanic membrane and ear canal normal.  Nose: Right sinus exhibits no maxillary sinus tenderness and no frontal sinus tenderness. Left sinus exhibits no maxillary sinus tenderness and no frontal sinus tenderness.  Mouth/Throat: Mucous membranes are normal.  Oropharyngeal exudate and posterior oropharyngeal erythema present. No tonsillar abscesses.  Eyes: Conjunctivae and lids are normal. Right eye exhibits no discharge. Left eye exhibits no discharge. No scleral icterus.  Neck: Neck supple. No edema present.  Cardiovascular: Normal rate and regular rhythm.   Pulmonary/Chest: Effort normal and breath sounds normal. No respiratory distress.  Abdominal: Normal appearance. There is no splenomegaly or hepatomegaly.  Musculoskeletal: Normal range of motion.  Neurological: She is alert and oriented to person, place, and time.  Skin: Skin is intact. No rash noted. No pallor.  Psychiatric: She has a normal mood and affect. Her behavior is normal. Judgment and thought content normal.    Results for orders placed or performed in visit on 07/30/16  Comprehensive metabolic panel  Result Value Ref Range   Glucose 67 65 - 99 mg/dL   BUN 14 6 - 24 mg/dL   Creatinine, Ser 4.09 (H) 0.57 - 1.00 mg/dL   GFR calc non Af Amer 58 (L) >59 mL/min/1.73   GFR calc Af Amer 67 >59 mL/min/1.73   BUN/Creatinine Ratio 12 9 - 23   Sodium 142 134 - 144 mmol/L   Potassium 4.5 3.5 - 5.2 mmol/L   Chloride 101 96 - 106 mmol/L   CO2 25 18 - 29 mmol/L   Calcium 9.7 8.7 - 10.2 mg/dL   Total Protein 7.4 6.0 - 8.5 g/dL   Albumin 4.5 3.5 - 5.5 g/dL   Globulin, Total 2.9 1.5 - 4.5 g/dL   Albumin/Globulin Ratio 1.6 1.2 - 2.2   Bilirubin Total 0.5  0.0 - 1.2 mg/dL   Alkaline Phosphatase 68 39 - 117 IU/L   AST 15 0 - 40 IU/L   ALT 10 0 - 32 IU/L  TSH  Result Value Ref Range   TSH 1.780 0.450 - 4.500 uIU/mL  Lipid Panel w/o Chol/HDL Ratio  Result Value Ref Range   Cholesterol, Total 227 (H) 100 - 199 mg/dL   Triglycerides 45 0 - 149 mg/dL   HDL 87 >16>39 mg/dL   VLDL Cholesterol Cal 9 5 - 40 mg/dL   LDL Calculated 109131 (H) 0 - 99 mg/dL  CBC with Differential/Platelet  Result Value Ref Range   WBC 5.4 3.4 - 10.8 x10E3/uL   RBC 5.10 3.77 - 5.28 x10E6/uL   Hemoglobin 12.5 11.1  - 15.9 g/dL   Hematocrit 60.439.7 54.034.0 - 46.6 %   MCV 78 (L) 79 - 97 fL   MCH 24.5 (L) 26.6 - 33.0 pg   MCHC 31.5 31.5 - 35.7 g/dL   RDW 98.116.5 (H) 19.112.3 - 47.815.4 %   Platelets 250 150 - 379 x10E3/uL   Neutrophils 54 Not Estab. %   Lymphs 34 Not Estab. %   Monocytes 8 Not Estab. %   Eos 4 Not Estab. %   Basos 0 Not Estab. %   Neutrophils Absolute 2.9 1.4 - 7.0 x10E3/uL   Lymphocytes Absolute 1.9 0.7 - 3.1 x10E3/uL   Monocytes Absolute 0.4 0.1 - 0.9 x10E3/uL   EOS (ABSOLUTE) 0.2 0.0 - 0.4 x10E3/uL   Basophils Absolute 0.0 0.0 - 0.2 x10E3/uL   Immature Granulocytes 0 Not Estab. %   Immature Grans (Abs) 0.0 0.0 - 0.1 x10E3/uL      Assessment & Plan:   Problem List Items Addressed This Visit    None    Visit Diagnoses    Tonsillitis    -  Primary   Rx for Amoxil 875 mg BID for 10 days.  Pt ed on care and salt water gargles       Follow up plan: Return if symptoms worsen or fail to improve.

## 2018-02-02 ENCOUNTER — Ambulatory Visit (INDEPENDENT_AMBULATORY_CARE_PROVIDER_SITE_OTHER): Payer: Managed Care, Other (non HMO) | Admitting: Physician Assistant

## 2018-02-02 ENCOUNTER — Encounter: Payer: Self-pay | Admitting: Physician Assistant

## 2018-02-02 VITALS — BP 115/73 | HR 72 | Temp 97.4°F | Ht 66.25 in | Wt 206.0 lb

## 2018-02-02 DIAGNOSIS — E669 Obesity, unspecified: Secondary | ICD-10-CM

## 2018-02-02 DIAGNOSIS — Z114 Encounter for screening for human immunodeficiency virus [HIV]: Secondary | ICD-10-CM | POA: Diagnosis not present

## 2018-02-02 DIAGNOSIS — Z1231 Encounter for screening mammogram for malignant neoplasm of breast: Secondary | ICD-10-CM

## 2018-02-02 DIAGNOSIS — Z0000 Encounter for general adult medical examination without abnormal findings: Secondary | ICD-10-CM

## 2018-02-02 DIAGNOSIS — Z124 Encounter for screening for malignant neoplasm of cervix: Secondary | ICD-10-CM

## 2018-02-02 DIAGNOSIS — Z1239 Encounter for other screening for malignant neoplasm of breast: Secondary | ICD-10-CM

## 2018-02-02 NOTE — Patient Instructions (Signed)
Please schedule mammogram and with [facility] for a PAP smear.   Health Maintenance, Female Adopting a healthy lifestyle and getting preventive care can go a long way to promote health and wellness. Talk with your health care provider about what schedule of regular examinations is right for you. This is a good chance for you to check in with your provider about disease prevention and staying healthy. In between checkups, there are plenty of things you can do on your own. Experts have done a lot of research about which lifestyle changes and preventive measures are most likely to keep you healthy. Ask your health care provider for more information. Weight and diet Eat a healthy diet  Be sure to include plenty of vegetables, fruits, low-fat dairy products, and lean protein.  Do not eat a lot of foods high in solid fats, added sugars, or salt.  Get regular exercise. This is one of the most important things you can do for your health. ? Most adults should exercise for at least 150 minutes each week. The exercise should increase your heart rate and make you sweat (moderate-intensity exercise). ? Most adults should also do strengthening exercises at least twice a week. This is in addition to the moderate-intensity exercise.  Maintain a healthy weight  Body mass index (BMI) is a measurement that can be used to identify possible weight problems. It estimates body fat based on height and weight. Your health care provider can help determine your BMI and help you achieve or maintain a healthy weight.  For females 48 years of age and older: ? A BMI below 18.5 is considered underweight. ? A BMI of 18.5 to 24.9 is normal. ? A BMI of 25 to 29.9 is considered overweight. ? A BMI of 30 and above is considered obese.  Watch levels of cholesterol and blood lipids  You should start having your blood tested for lipids and cholesterol at 46 years of age, then have this test every 5 years.  You may need to have  your cholesterol levels checked more often if: ? Your lipid or cholesterol levels are high. ? You are older than 46 years of age. ? You are at high risk for heart disease.  Cancer screening Lung Cancer  Lung cancer screening is recommended for adults 85-25 years old who are at high risk for lung cancer because of a history of smoking.  A yearly low-dose CT scan of the lungs is recommended for people who: ? Currently smoke. ? Have quit within the past 15 years. ? Have at least a 30-pack-year history of smoking. A pack year is smoking an average of one pack of cigarettes a day for 1 year.  Yearly screening should continue until it has been 15 years since you quit.  Yearly screening should stop if you develop a health problem that would prevent you from having lung cancer treatment.  Breast Cancer  Practice breast self-awareness. This means understanding how your breasts normally appear and feel.  It also means doing regular breast self-exams. Let your health care provider know about any changes, no matter how small.  If you are in your 20s or 30s, you should have a clinical breast exam (CBE) by a health care provider every 1-3 years as part of a regular health exam.  If you are 28 or older, have a CBE every year. Also consider having a breast X-ray (mammogram) every year.  If you have a family history of breast cancer, talk to your health  care provider about genetic screening.  If you are at high risk for breast cancer, talk to your health care provider about having an MRI and a mammogram every year.  Breast cancer gene (BRCA) assessment is recommended for women who have family members with BRCA-related cancers. BRCA-related cancers include: ? Breast. ? Ovarian. ? Tubal. ? Peritoneal cancers.  Results of the assessment will determine the need for genetic counseling and BRCA1 and BRCA2 testing.  Cervical Cancer Your health care provider may recommend that you be screened  regularly for cancer of the pelvic organs (ovaries, uterus, and vagina). This screening involves a pelvic examination, including checking for microscopic changes to the surface of your cervix (Pap test). You may be encouraged to have this screening done every 3 years, beginning at age 41.  For women ages 60-65, health care providers may recommend pelvic exams and Pap testing every 3 years, or they may recommend the Pap and pelvic exam, combined with testing for human papilloma virus (HPV), every 5 years. Some types of HPV increase your risk of cervical cancer. Testing for HPV may also be done on women of any age with unclear Pap test results.  Other health care providers may not recommend any screening for nonpregnant women who are considered low risk for pelvic cancer and who do not have symptoms. Ask your health care provider if a screening pelvic exam is right for you.  If you have had past treatment for cervical cancer or a condition that could lead to cancer, you need Pap tests and screening for cancer for at least 20 years after your treatment. If Pap tests have been discontinued, your risk factors (such as having a new sexual partner) need to be reassessed to determine if screening should resume. Some women have medical problems that increase the chance of getting cervical cancer. In these cases, your health care provider may recommend more frequent screening and Pap tests.  Colorectal Cancer  This type of cancer can be detected and often prevented.  Routine colorectal cancer screening usually begins at 46 years of age and continues through 46 years of age.  Your health care provider may recommend screening at an earlier age if you have risk factors for colon cancer.  Your health care provider may also recommend using home test kits to check for hidden blood in the stool.  A small camera at the end of a tube can be used to examine your colon directly (sigmoidoscopy or colonoscopy). This is  done to check for the earliest forms of colorectal cancer.  Routine screening usually begins at age 28.  Direct examination of the colon should be repeated every 5-10 years through 46 years of age. However, you may need to be screened more often if early forms of precancerous polyps or small growths are found.  Skin Cancer  Check your skin from head to toe regularly.  Tell your health care provider about any new moles or changes in moles, especially if there is a change in a mole's shape or color.  Also tell your health care provider if you have a mole that is larger than the size of a pencil eraser.  Always use sunscreen. Apply sunscreen liberally and repeatedly throughout the day.  Protect yourself by wearing long sleeves, pants, a wide-brimmed hat, and sunglasses whenever you are outside.  Heart disease, diabetes, and high blood pressure  High blood pressure causes heart disease and increases the risk of stroke. High blood pressure is more likely to develop  in: ? People who have blood pressure in the high end of the normal range (130-139/85-89 mm Hg). ? People who are overweight or obese. ? People who are African American.  If you are 9-14 years of age, have your blood pressure checked every 3-5 years. If you are 18 years of age or older, have your blood pressure checked every year. You should have your blood pressure measured twice-once when you are at a hospital or clinic, and once when you are not at a hospital or clinic. Record the average of the two measurements. To check your blood pressure when you are not at a hospital or clinic, you can use: ? An automated blood pressure machine at a pharmacy. ? A home blood pressure monitor.  If you are between 57 years and 59 years old, ask your health care provider if you should take aspirin to prevent strokes.  Have regular diabetes screenings. This involves taking a blood sample to check your fasting blood sugar level. ? If you are  at a normal weight and have a low risk for diabetes, have this test once every three years after 46 years of age. ? If you are overweight and have a high risk for diabetes, consider being tested at a younger age or more often. Preventing infection Hepatitis B  If you have a higher risk for hepatitis B, you should be screened for this virus. You are considered at high risk for hepatitis B if: ? You were born in a country where hepatitis B is common. Ask your health care provider which countries are considered high risk. ? Your parents were born in a high-risk country, and you have not been immunized against hepatitis B (hepatitis B vaccine). ? You have HIV or AIDS. ? You use needles to inject street drugs. ? You live with someone who has hepatitis B. ? You have had sex with someone who has hepatitis B. ? You get hemodialysis treatment. ? You take certain medicines for conditions, including cancer, organ transplantation, and autoimmune conditions.  Hepatitis C  Blood testing is recommended for: ? Everyone born from [birth year] through 1964. ? Anyone with known risk factors for hepatitis C.  Sexually transmitted infections (STIs)  You should be screened for sexually transmitted infections (STIs) including gonorrhea and chlamydia if: ? You are sexually active and are younger than 46 years of age. ? You are older than 46 years of age and your health care provider tells you that you are at risk for this type of infection. ? Your sexual activity has changed since you were last screened and you are at an increased risk for chlamydia or gonorrhea. Ask your health care provider if you are at risk.  If you do not have HIV, but are at risk, it may be recommended that you take a prescription medicine daily to prevent HIV infection. This is called pre-exposure prophylaxis (PrEP). You are considered at risk if: ? You are sexually active and do not regularly use condoms or know the HIV status of your  partner(s). ? You take drugs by injection. ? You are sexually active with a partner who has HIV.  Talk with your health care provider about whether you are at high risk of being infected with HIV. If you choose to begin PrEP, you should first be tested for HIV. You should then be tested every 3 months for as long as you are taking PrEP. Pregnancy  If you are premenopausal and you may become pregnant, ask  your health care provider about preconception counseling.  If you may become pregnant, take 400 to 800 micrograms (mcg) of folic acid every day.  If you want to prevent pregnancy, talk to your health care provider about birth control (contraception). Osteoporosis and menopause  Osteoporosis is a disease in which the bones lose minerals and strength with aging. This can result in serious bone fractures. Your risk for osteoporosis can be identified using a bone density scan.  If you are 13 years of age or older, or if you are at risk for osteoporosis and fractures, ask your health care provider if you should be screened.  Ask your health care provider whether you should take a calcium or vitamin D supplement to lower your risk for osteoporosis.  Menopause may have certain physical symptoms and risks.  Hormone replacement therapy may reduce some of these symptoms and risks. Talk to your health care provider about whether hormone replacement therapy is right for you. Follow these instructions at home:  Schedule regular health, dental, and eye exams.  Stay current with your immunizations.  Do not use any tobacco products including cigarettes, chewing tobacco, or electronic cigarettes.  If you are pregnant, do not drink alcohol.  If you are breastfeeding, limit how much and how often you drink alcohol.  Limit alcohol intake to no more than 1 drink per day for nonpregnant women. One drink equals 12 ounces of beer, 5 ounces of wine, or 1 ounces of hard liquor.  Do not use street  drugs.  Do not share needles.  Ask your health care provider for help if you need support or information about quitting drugs.  Tell your health care provider if you often feel depressed.  Tell your health care provider if you have ever been abused or do not feel safe at home. This information is not intended to replace advice given to you by your health care provider. Make sure you discuss any questions you have with your health care provider. Document Released: 01/07/2011 Document Revised: 11/30/2015 Document Reviewed: 03/28/2015 Elsevier Interactive Patient Education  [employer].

## 2018-02-02 NOTE — Progress Notes (Signed)
Subjective:    Patient ID: Jenna Fuller, female    DOB: 08-Jan-1971, 47 y.o.   MRN: [id number]  Jenna Fuller is a 47 y.o. 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 Presenting today for annual physical. Got PAP without HPV in 2015/2016 with [facility]ef[facility] Bryant, she does not want to get PAP here. Currently menopausal, last menstrual cycle was two years ago. Last mammogram 11/2016 was normal, mother with breast cancer in her 37's. Does not have family history of colon cancer. She plans on seeing Dr. Wynetta Emery in the future as her current PCP is leaving.  She is concerned about recent weight gain which she reports has happened over the past year despite maintaining the same strict diet and exercise regimen. Worries about her fasting blood sugars increasing, which have ranged from 71-80 on screenings from [facility]. She would like to see a nutritionist.     Past Medical History:  Diagnosis Date  . Mammographic microcalcification    Past Surgical History:  Procedure Laterality Date  . BREAST BIOPSY Left 2013   neg   Social History   Socioeconomic History  . Marital status: Married    Spouse name: Not on file  . Number of children: Not on file  . Years of education: Not on file  . Highest education level: Not on file  Occupational History  . Not on file  Social Needs  . Financial resource strain: Not on file  . Food insecurity:    Worry: Not on file    Inability: Not on file  . Transportation needs:    Medical: Not on file    Non-medical: Not on file  Tobacco Use  . Smoking status: Never Smoker  . Smokeless tobacco: Never Used  Substance and Sexual Activity  . Alcohol use: No  . Drug use: No  . Sexual activity: Never  Lifestyle  . Physical activity:    Days per week: Not on file    Minutes per session: Not on file  . Stress: Not on file   Relationships  . Social connections:    Talks on phone: Not on file    Gets together: Not on file    Attends religious service: Not on file    Active member of club or organization: Not on file    Attends meetings of clubs or organizations: Not on file    Relationship status: Not on file  . Intimate partner violence:    Fear of current or ex partner: Not on file    Emotionally abused: Not on file    Physically abused: Not on file    Forced sexual activity: Not on file  Other Topics Concern  . Not on file  Social History Narrative  . Not on file   Family History  Problem Relation Age of Onset  . Breast cancer Mother 57  . Cancer Mother        breast  . Ulcerative colitis Sister   . Diabetes Paternal Grandmother   . Hypertension Paternal Grandmother   . Breast cancer Maternal Aunt 60  . Breast cancer Paternal Aunt 56   No current outpatient medications on file prior to visit.   No current facility-administered medications on file prior to visit.     Review of Systems Per HPI unless specifically indicated above     Objective:    BP 115/73 (  BP Location: Left Arm, Patient Position: Sitting, Cuff Size: Normal)   Pulse 72   Temp (!) 97.4 F (36.3 C) (Tympanic)   Ht 5' 6.25" (1.683 m)   Wt 206 lb (93.4 kg)   SpO2 99%   BMI 33.00 kg/m   Wt Readings from Last 3 Encounters:  02/02/18 206 lb (93.4 kg)  07/30/16 172 lb 9.6 oz (78.3 kg)  10/24/15 206 lb 12.8 oz (93.8 kg)    Physical Exam  Constitutional: She is oriented to person, place, and time. She appears well-developed and well-nourished.  Cardiovascular: Normal rate and regular rhythm.  Pulmonary/Chest: Effort normal and breath sounds normal.  Abdominal: Soft. Bowel sounds are normal. She exhibits no distension. There is no tenderness.  Neurological: She is alert and oriented to person, place, and time.  Skin: Skin is warm and dry.  Psychiatric: She has a normal mood and affect. Her behavior is normal.    Results for orders placed or performed in visit on 02/02/18  HM PAP SMEAR  Result Value Ref Range   HM Pap smear Negative   HM PAP SMEAR  Result Value Ref Range   HM Pap smear Negative   HM PAP SMEAR  Result Value Ref Range   HM Pap smear Negative       Assessment & Plan:   1. Annual physical exam  Labs as below. Explained her fasting sugars are well within the normal range. Think seeing a nutritionist is a great idea and will refer as below.   - CBC with Differential - Comp Met (CMET) - TSH - Lipid Profile - HIV antibody - HgB A1c  2. Encounter for screening for HIV   3. Obesity without serious comorbidity, unspecified classification, unspecified obesity type  - Amb ref to Medical Nutrition Therapy-MNT  4. Breast cancer screening  Ordered mammogram, patient knows of self scheduling. - MM Digital Screening; Future  5. Cervical cancer screening  Advised she needs to schedule PAP smear with [facility] as the interval for PAP without HPV is 3 years, so she is technically due this year.     Follow up plan: Return in about 1 year (around 02/03/2019) for CPE .  Carles Collet, PA-C [facility] 02/02/2018, 3:26 PM

## 2018-02-03 ENCOUNTER — Other Ambulatory Visit: Payer: Self-pay | Admitting: Physician Assistant

## 2018-02-03 DIAGNOSIS — E785 Hyperlipidemia, unspecified: Secondary | ICD-10-CM

## 2018-02-03 LAB — COMPREHENSIVE METABOLIC PANEL
ALT: 14 IU/L (ref 0–32)
AST: 21 IU/L (ref 0–40)
Albumin/Globulin Ratio: 1.8 (ref 1.2–2.2)
Albumin: 4.5 g/dL (ref 3.5–5.5)
Alkaline Phosphatase: 61 IU/L (ref 39–117)
BUN/Creatinine Ratio: 20 (ref 9–23)
BUN: 19 mg/dL (ref 6–24)
Bilirubin Total: 0.4 mg/dL (ref 0.0–1.2)
CO2: 24 mmol/L (ref 20–29)
Calcium: 9.5 mg/dL (ref 8.7–10.2)
Chloride: 102 mmol/L (ref 96–106)
Creatinine, Ser: 0.93 mg/dL (ref 0.57–1.00)
GFR calc Af Amer: 86 mL/min/{1.73_m2} (ref >59)
GFR calc non Af Amer: 74 mL/min/{1.73_m2} (ref >59)
Globulin, Total: 2.5 g/dL (ref 1.5–4.5)
Glucose: 74 mg/dL (ref 65–99)
Potassium: 4.5 mmol/L (ref 3.5–5.2)
Sodium: 141 mmol/L (ref 134–144)
Total Protein: 7 g/dL (ref 6.0–8.5)

## 2018-02-03 LAB — CBC WITH DIFFERENTIAL/PLATELET
Basophils Absolute: 0 10*3/uL (ref 0.0–0.2)
Basos: 0 %
EOS (ABSOLUTE): 0.2 10*3/uL (ref 0.0–0.4)
Eos: 4 %
Hematocrit: 38.7 % (ref 34.0–46.6)
Hemoglobin: 12.7 g/dL (ref 11.1–15.9)
Immature Grans (Abs): 0 10*3/uL (ref 0.0–0.1)
Immature Granulocytes: 0 %
Lymphocytes Absolute: 2.2 10*3/uL (ref 0.7–3.1)
Lymphs: 42 %
MCH: 26.4 pg — ABNORMAL LOW (ref 26.6–33.0)
MCHC: 32.8 g/dL (ref 31.5–35.7)
MCV: 81 fL (ref 79–97)
Monocytes Absolute: 0.3 10*3/uL (ref 0.1–0.9)
Monocytes: 6 %
Neutrophils Absolute: 2.5 10*3/uL (ref 1.4–7.0)
Neutrophils: 48 %
Platelets: 253 10*3/uL (ref 150–450)
RBC: 4.81 x10E6/uL (ref 3.77–5.28)
RDW: 14.7 % (ref 12.3–15.4)
WBC: 5.3 10*3/uL (ref 3.4–10.8)

## 2018-02-03 LAB — LIPID PANEL
Chol/HDL Ratio: 3.3 ratio (ref 0.0–4.4)
Cholesterol, Total: 319 mg/dL — ABNORMAL HIGH (ref 100–199)
HDL: 97 mg/dL (ref >39)
LDL Calculated: 211 mg/dL — ABNORMAL HIGH (ref 0–99)
Triglycerides: 57 mg/dL (ref 0–149)
VLDL Cholesterol Cal: 11 mg/dL (ref 5–40)

## 2018-02-03 LAB — HEMOGLOBIN A1C
Est. average glucose Bld gHb Est-mCnc: 114 mg/dL
Hgb A1c MFr Bld: 5.6 % (ref 4.8–5.6)

## 2018-02-03 LAB — HIV ANTIBODY (ROUTINE TESTING W REFLEX): HIV Screen 4th Generation wRfx: NONREACTIVE

## 2018-02-03 LAB — TSH: TSH: 2.32 u[IU]/mL (ref 0.450–4.500)

## 2018-02-03 MED ORDER — ATORVASTATIN CALCIUM 10 MG PO TABS: 10.0000 mg | ORAL_TABLET | Freq: Every day | ORAL | 0 refills | Status: DC

## 2018-02-03 NOTE — Progress Notes (Signed)
Sent in Lipitor 10 mg. Can cause some muscle aches, if this is an issue can take with COQ10. Will get labs at follow up. Thank you.

## 2018-02-17 ENCOUNTER — Encounter: Payer: Managed Care, Other (non HMO) | Attending: Family Medicine | Admitting: Dietician

## 2018-02-17 ENCOUNTER — Encounter: Payer: Self-pay | Admitting: Dietician

## 2018-02-17 VITALS — Ht 66.25 in | Wt 206.8 lb

## 2018-02-17 DIAGNOSIS — E669 Obesity, unspecified: Secondary | ICD-10-CM | POA: Insufficient documentation

## 2018-02-17 DIAGNOSIS — E6609 Other obesity due to excess calories: Secondary | ICD-10-CM

## 2018-02-17 DIAGNOSIS — Z713 Dietary counseling and surveillance: Secondary | ICD-10-CM | POA: Diagnosis present

## 2018-02-17 DIAGNOSIS — E785 Hyperlipidemia, unspecified: Secondary | ICD-10-CM | POA: Diagnosis not present

## 2018-02-17 DIAGNOSIS — Z6833 Body mass index (BMI) 33.0-33.9, adult: Secondary | ICD-10-CM

## 2018-02-17 NOTE — Patient Instructions (Signed)
Balance meals with 2-3 oz protein, 2-3 servings of carbohydrate and non-starchy vegetables. Refer to food guide plate and "Planning a Balanced Meal" given and instructed on at today's visit. Increase intake of soluble fiber with ideal goal of 10 gms daily. Try one of the margarines on list given that have plant stanols added. Goal for saturated fat daily -less than 12 gms Refer to cookbook given for recipes that are low in saturated fat, sodium and within carbohydrate recommendations.

## 2018-02-17 NOTE — Progress Notes (Signed)
Medical Nutrition Therapy: Visit start time: 1500  end time: 1610 Assessment:  Diagnosis: obesity Past medical history:hyperlipidemia  Psychosocial issues/ stress concerns: none identified Preferred learning method:  . Auditory . Visual . Hands-on  Current weight: 206.8 lbs Height: 66.25 in Medications, supplements: see list  Progress and evaluation:  Patient in for initial medical nutrition therapy visit. She reports that she had been following the Keto diet for weight loss for approx. 2 months when she had her lipids checked. She brought the lab results from 1 year ago to compare to most recent results. Her total cholesterol increased from 201 to 319 and her LDL increased from 122 to 161211.  Her HDL is excellent at 97.She was prescribed Lipitor but she has not taken it. She instead bought red rice yeast and has been taking along with CoQ-10. She states that while following the keto diet, she was eating cheese, butter, sour cream etc, several times daily. "I don't want to take medication since I feel that my cholesterol will come down since I am no longer on Keto diet". She states that she did not lose weight on the Keto diet. She expressed frustration regarding lack of weight loss despite making healthy food choices. Most of her meals are prepared at home. Based on information given regarding her meals: oatmeal, fruit or a shake with protein powder, fruit and avocado for breakfast and a salad full of vegetables with chicken, or shrimp or salmon, oil and vinegar dressing for lunch and dinner, difficult to explain lack of weight loss. Her beverages are water, coffee (1 cup) and herbal tea (unsweetened).   Physical activity: walking, eliptical, weight training -30 minutes, 5 days/week.  Nutrition Care Education: Weight control:  Instructed on a meal plan based on 1600 calories including identifying carbohydrate foods, portion control and balance of protein, carbohydrate and non-starchy  vegetables. Encouraged to use the plan as a guide to help increase mindfulness of food intake. Used InBody body composition analysis to show 1400 calories as basal metabolic rate and encouraged to avoid eating less than BMR calories. Encouraged to focus on the diet changes to lower cholesterol which may also help with weight loss. Hyperlipidemia; Discussed how red yeast rice supplements are not regulated and may or may not have consistent amounts of the active ingredient that acts as a statin.  Instructed on grapefruit/red yeast rice interaction. Encouraged to try margarines that contain plant stanols and gave a list. Also, gave a list of foods and soluble fiber content that play a role in lowering cholesterol. Gave recommendations for saturated fat limits. Gave a cookbook that list amounts of saturated fat, sodium, carbohydrate since patient said that she enjoys cooking and trying new recipes. Nutritional Diagnosis:  NI-5.6.3 Inappropriate intake of fats (specify): saturated fat As related to following Keto diet until 2 weeks ago .  As evidenced by diet history..  Intervention:  Balance meals with 2-3 oz protein, 2-3 servings of carbohydrate and non-starchy vegetables. Refer to food guide plate and "Planning a Balanced Meal" given and instructed on at today's visit. Increase intake of soluble fiber with ideal goal of 10 gms daily. Try one of the margarines on list given that have plant stanols added. Goal for saturated fat daily -less than 12 gms Refer to cookbook given for recipes that are low in saturated fat, sodium and within carbohydrate recommendations.  Education Materials given:  . Plate Planner . Food lists/ Planning A Balanced Meal . Cookbook . Sample meal pattern/ menus . Good  Fats/Bad Fats with recipes . Goals/ instructions Learner/ who was taught:  . Patient  Level of understanding: Verbalizes understanding  Demonstrated degree of understanding via:   Teach  back Learning barriers: . None  Willingness to learn/ readiness for change: . Eager, change in progress   Monitoring and Evaluation:  Dietary intake, and body weight, body composition analysis      follow up: 03/17/18 at 3:00pm

## 2018-03-17 ENCOUNTER — Ambulatory Visit: Payer: Managed Care, Other (non HMO) | Admitting: Dietician

## 2018-04-02 ENCOUNTER — Encounter: Payer: Self-pay | Admitting: Dietician

## 2018-04-24 ENCOUNTER — Encounter: Payer: Self-pay | Admitting: Family Medicine

## 2018-04-24 ENCOUNTER — Telehealth: Payer: Self-pay | Admitting: Nurse Practitioner

## 2018-04-24 NOTE — Telephone Encounter (Signed)
Copied from [place] [id number]. Topic: General - Other >> Apr 24, 2018 11:36 AM Ahmed Prima L wrote: Reason for CRM: Patient states she seen Carles Collet a few months ago and her cholesterol was high. She would like to know could she have a script to take to her job [employer] and have that re-checked. She has changed her diet and wants to see if it has improved? Please Advise.   Call back @ [phone]    Response from Marnee Guarneri, NP 2:59PM Saw CRM on this patient in regard to lipid panel.  As I have not met her yet would prefer not to start statin without further assessment and discussion of medication.   Can we see if she can come in and meet with me to further discuss in upcoming weeks?     Message from [employer] 3:12PM Spoke with pt and she stated that she doesn't want to start a medication she would just like the order placed and sent to [facility] so she can get her cholesterol checked.

## 2018-04-24 NOTE — Telephone Encounter (Signed)
Does she want to come in here and just get a lipid panel?  Which [facility] does she want order sent to? Might be better if she just came in here for lab draw only.

## 2018-04-27 ENCOUNTER — Telehealth: Payer: Self-pay | Admitting: Nurse Practitioner

## 2018-04-27 DIAGNOSIS — Z13220 Encounter for screening for lipoid disorders: Secondary | ICD-10-CM

## 2018-04-27 NOTE — Telephone Encounter (Signed)
Patient requesting lipid panel.  Labs ordered and to be performed at patient workplace per her request.

## 2018-05-08 LAB — LIPID PANEL
Chol/HDL Ratio: 2.6 ratio (ref 0.0–4.4)
Cholesterol, Total: 240 mg/dL — ABNORMAL HIGH (ref 100–199)
HDL: 91 mg/dL (ref >39)
LDL Calculated: 135 mg/dL — ABNORMAL HIGH (ref 0–99)
Triglycerides: 71 mg/dL (ref 0–149)
VLDL Cholesterol Cal: 14 mg/dL (ref 5–40)

## 2018-08-07 ENCOUNTER — Other Ambulatory Visit: Payer: Self-pay

## 2018-08-07 ENCOUNTER — Ambulatory Visit (INDEPENDENT_AMBULATORY_CARE_PROVIDER_SITE_OTHER): Payer: Managed Care, Other (non HMO) | Admitting: Maternal Newborn

## 2018-08-07 ENCOUNTER — Encounter: Payer: Self-pay | Admitting: Maternal Newborn

## 2018-08-07 VITALS — BP 120/72 | HR 75 | Ht 65.0 in | Wt 208.0 lb

## 2018-08-07 DIAGNOSIS — Z124 Encounter for screening for malignant neoplasm of cervix: Secondary | ICD-10-CM

## 2018-08-07 DIAGNOSIS — Z01419 Encounter for gynecological examination (general) (routine) without abnormal findings: Secondary | ICD-10-CM | POA: Diagnosis not present

## 2018-08-07 NOTE — Progress Notes (Signed)
Gynecology Annual Exam  PCP: Dorcas CarrowJohnson, Megan P, DO  Chief Complaint:  Chief Complaint  Patient presents with  . Gynecologic Exam    NO complaints    History of Present Illness: Patient is a 48 y.o. G1P1 presents for annual exam. The patient has no complaints today.   LMP: No LMP recorded. (Menstrual status: Perimenopausal). Has not had a period for > 1 year.  The patient is not currently sexually active. She currently uses none for contraception.  The patient does perform self breast exams.  There is notable family history of breast cancer in her mother at age 47. No other history of breast or ovarian cancer in her family.  The patient wears seatbelts: yes.   The patient has regular exercise: yes.    The patient denies current symptoms of depression.    Review of Systems  Constitutional: Negative.   HENT: Negative.   Eyes: Negative.   Respiratory: Negative for shortness of breath and wheezing.   Cardiovascular: Negative for chest pain and palpitations.  Gastrointestinal: Negative for abdominal pain.  Genitourinary: Negative.   Musculoskeletal: Negative.   Skin: Negative.   Neurological: Negative.   Endo/Heme/Allergies:       Hot flashes  Psychiatric/Behavioral: Negative for depression. The patient is not nervous/anxious.   All other systems reviewed and are negative.   Past Medical History:  Past Medical History:  Diagnosis Date  . Mammographic microcalcification     Past Surgical History:  Past Surgical History:  Procedure Laterality Date  . BREAST BIOPSY Left 2013   neg  . NO PAST SURGERIES      Gynecologic History:  No LMP recorded. (Menstrual status: Perimenopausal). Contraception: none Last Pap: 04/27/2014.  Results were: NILM Last mammogram: 11/21/2016. Results were: BI-RAD I Obstetric History: G1P1  Family History:  Family History  Problem Relation Age of Onset  . Breast cancer Mother [id number]       BRACA NEG  . Cancer Mother        breast  .  Ulcerative colitis Sister   . Diabetes Paternal Grandmother   . Hypertension Paternal Grandmother   . Breast cancer Maternal Aunt 60  . Breast cancer Paternal Aunt [id number]    Social History:  Social History   Socioeconomic History  . Marital status: Married    Spouse name: Not on file  . Number of children: Not on file  . Years of education: Not on file  . Highest education level: Not on file  Occupational History  . Not on file  Social Needs  . Financial resource strain: Not on file  . Food insecurity:    Worry: Not on file    Inability: Not on file  . Transportation needs:    Medical: Not on file    Non-medical: Not on file  Tobacco Use  . Smoking status: Never Smoker  . Smokeless tobacco: Never Used  Substance and Sexual Activity  . Alcohol use: No  . Drug use: No  . Sexual activity: Not Currently  Lifestyle  . Physical activity:    Days per week: 5 days    Minutes per session: 60 min  . Stress: Not on file  Relationships  . Social connections:    Talks on phone: Not on file    Gets together: Not on file    Attends religious service: Not on file    Active member of club or organization: Not on file    Attends meetings of clubs or organizations: Not  on file    Relationship status: Not on file  . Intimate partner violence:    Fear of current or ex partner: Not on file    Emotionally abused: Not on file    Physically abused: Not on file    Forced sexual activity: Not on file  Other Topics Concern  . Not on file  Social History Narrative  . Not on file    Allergies:  No Known Allergies  Medications: Prior to Admission medications   Medication Sig Start Date End Date Taking? Authorizing Provider  Coenzyme Q10 (CO Q 10 PO) Take by mouth.   Yes [provider]  Red Yeast Rice Extract (RED YEAST RICE PO) Take by mouth.   Yes [provider]  atorvastatin (LIPITOR) 10 MG tablet Take 1 tablet (10 mg total) by mouth daily. Patient not taking:  Reported on 02/17/2018 02/03/18   Trey Sailors, PA-C    Physical Exam Vitals: Blood pressure 120/72, pulse 75, height 5\' 5"  (1.651 m), weight 208 lb (94.3 kg).  General: NAD HEENT: normocephalic, anicteric Thyroid: no enlargement, no palpable nodules Pulmonary: No increased work of breathing, CTAB Cardiovascular: RRR, no murmurs, rubs, or gallops Breast: Breasts symmetrical, no tenderness, no palpable nodules or masses, no skin or nipple retraction present, no nipple discharge.  No axillary or supraclavicular lymphadenopathy. Abdomen: Soft, non-tender, non-distended.  Umbilicus without lesions.  No hepatomegaly, splenomegaly or masses palpable. No evidence of hernia  Genitourinary:  External: Normal external female genitalia.  Normal urethral  meatus, normal Bartholin's and Skene's glands.    Vagina: Normal vaginal mucosa, no evidence of prolapse.    Cervix: Grossly normal in appearance, no bleeding  Uterus: Non-enlarged, mobile, normal contour.  No CMT  Adnexa: ovaries non-enlarged, no adnexal masses  Rectal: deferred  Lymphatic: no evidence of inguinal lymphadenopathy Extremities: no edema, erythema, or tenderness Neurologic: Grossly intact Psychiatric: mood appropriate, affect full  Assessment: 48 y.o. G1P1 here for an annual exam  Plan: Problem List Items Addressed This Visit    None    Visit Diagnoses    Pap smear for cervical cancer screening    -  Primary   Relevant Orders   Pap IG and HPV (high risk) DNA detection   Women's annual routine gynecological examination       Relevant Orders   Pap IG and HPV (high risk) DNA detection      1) Mammogram - recommend yearly screening mammogram.  Mammogram ordered by primary care physician. [place] card given and she plans to schedule.  2) STI screening was offered and declined.  3) ASCCP guidelines and rationale discussed.  Patient opts for every 3 year screening interval.  4) Contraception - Not sexually active and  has not had a cycle for > 1 year.  5) Colonoscopy -- Screening recommended starting at age 16 for average risk individuals, age 14 for individuals deemed at increased risk (including African Americans) and recommended to continue until age 58.  For patient age 30-85 individualized approach is recommended.  Gold standard screening is via colonoscopy, Cologuard screening is an acceptable alternative for patient unwilling or unable to undergo colonoscopy.  "Colorectal cancer screening for average?risk adults: 2018 guideline update from the American Cancer Society"[place]: A Cancer Journal for Clinicians: Dec 04, 2016   6) Routine healthcare maintenance including cholesterol, diabetes screening managed by PCP.  7) Declines medications for hot flashes, she has tried pharmacological therapy in the past and does not prefer it.  Marcelyn Bruins, CNM  08/07/2018        

## 2018-08-10 ENCOUNTER — Encounter: Payer: Self-pay | Admitting: Maternal Newborn

## 2018-08-10 ENCOUNTER — Other Ambulatory Visit: Payer: Self-pay | Admitting: Physician Assistant

## 2018-08-10 DIAGNOSIS — Z1239 Encounter for other screening for malignant neoplasm of breast: Secondary | ICD-10-CM

## 2018-08-14 LAB — PAP IG AND HPV HIGH-RISK: HPV, high-risk: NEGATIVE

## 2018-09-07 ENCOUNTER — Telehealth: Payer: Self-pay

## 2018-09-07 NOTE — Telephone Encounter (Signed)
Pt saw Lessie Dings on 08/07/18.  Can she have additional hormone testing done in menopause?  [phone]

## 2018-09-09 ENCOUNTER — Telehealth: Payer: Self-pay | Admitting: Maternal Newborn

## 2018-09-09 NOTE — Telephone Encounter (Signed)
Patient is calling to speak with Jaci. Patient is requesting additional labs would like a call back please. Patient aware [place] [facility] is on call today at Hospital. Please advise

## 2018-09-11 ENCOUNTER — Other Ambulatory Visit: Payer: Self-pay | Admitting: Maternal Newborn

## 2018-09-11 DIAGNOSIS — Z1329 Encounter for screening for other suspected endocrine disorder: Secondary | ICD-10-CM

## 2018-09-11 DIAGNOSIS — N951 Menopausal and female climacteric states: Secondary | ICD-10-CM

## 2018-09-11 NOTE — Progress Notes (Signed)
Lab orders entered as discussed with patient.

## 2018-09-15 ENCOUNTER — Encounter: Payer: Self-pay | Admitting: Maternal Newborn

## 2018-09-16 ENCOUNTER — Ambulatory Visit
Admission: RE | Admit: 2018-09-16 | Discharge: 2018-09-16 | Disposition: A | Discharge status: Home / Self Care | Payer: Managed Care, Other (non HMO) | Source: Ambulatory Visit | Attending: Physician Assistant | Admitting: Physician Assistant

## 2018-09-16 DIAGNOSIS — Z1231 Encounter for screening mammogram for malignant neoplasm of breast: Secondary | ICD-10-CM | POA: Diagnosis not present

## 2018-09-16 DIAGNOSIS — Z1239 Encounter for other screening for malignant neoplasm of breast: Secondary | ICD-10-CM

## 2020-03-22 ENCOUNTER — Other Ambulatory Visit: Payer: Self-pay

## 2020-03-22 ENCOUNTER — Ambulatory Visit
Admission: RE | Admit: 2020-03-22 | Discharge: 2020-03-22 | Disposition: A | Discharge status: Home / Self Care | Payer: Managed Care, Other (non HMO) | Source: Ambulatory Visit | Attending: Family Medicine | Admitting: Family Medicine

## 2020-03-22 ENCOUNTER — Encounter: Payer: Self-pay | Admitting: Family Medicine

## 2020-03-22 ENCOUNTER — Ambulatory Visit (INDEPENDENT_AMBULATORY_CARE_PROVIDER_SITE_OTHER): Payer: Managed Care, Other (non HMO) | Admitting: Family Medicine

## 2020-03-22 VITALS — BP 133/89 | HR 74 | Temp 98.2°F | Ht 65.0 in | Wt 222.0 lb

## 2020-03-22 DIAGNOSIS — E785 Hyperlipidemia, unspecified: Secondary | ICD-10-CM | POA: Diagnosis not present

## 2020-03-22 DIAGNOSIS — Z0000 Encounter for general adult medical examination without abnormal findings: Secondary | ICD-10-CM | POA: Diagnosis not present

## 2020-03-22 DIAGNOSIS — Z1231 Encounter for screening mammogram for malignant neoplasm of breast: Secondary | ICD-10-CM | POA: Diagnosis not present

## 2020-03-22 DIAGNOSIS — N951 Menopausal and female climacteric states: Secondary | ICD-10-CM

## 2020-03-22 DIAGNOSIS — E6609 Other obesity due to excess calories: Secondary | ICD-10-CM

## 2020-03-22 DIAGNOSIS — Z6836 Body mass index (BMI) 36.0-36.9, adult: Secondary | ICD-10-CM

## 2020-03-22 DIAGNOSIS — Z1211 Encounter for screening for malignant neoplasm of colon: Secondary | ICD-10-CM

## 2020-03-22 LAB — URINALYSIS, ROUTINE W REFLEX MICROSCOPIC
Bilirubin, UA: NEGATIVE
Glucose, UA: NEGATIVE
Ketones, UA: NEGATIVE
Leukocytes,UA: NEGATIVE
Nitrite, UA: NEGATIVE
Protein,UA: NEGATIVE
RBC, UA: NEGATIVE
Specific Gravity, UA: 1.01 (ref 1.005–1.030)
Urobilinogen, Ur: 0.2 mg/dL (ref 0.2–1.0)
pH, UA: 6 (ref 5.0–7.5)

## 2020-03-22 MED ORDER — GABAPENTIN 100 MG PO CAPS: 100.0000 mg | ORAL_CAPSULE | Freq: Three times a day (TID) | ORAL | 3 refills | Status: DC

## 2020-03-22 NOTE — Progress Notes (Signed)
BP 133/89 (BP Location: Right Arm, Patient Position: Sitting)   Pulse 74   Temp 98.2 F (36.8 C) (Oral)   Ht 5\' 5"  (1.651 m)   Wt 222 lb (100.7 kg)   LMP 04/15/2016 (Approximate)   SpO2 97%   BMI 36.94 kg/m    Subjective:    Patient ID: 06/15/2016, female    DOB: 15-Sep-1970, 49 y.o.   MRN: [id number]  HPI: Jenna Fuller is a 49 y.o. female presenting on 03/22/2020 for comprehensive medical examination. Current medical complaints include:   MENOPAUSAL SYMPTOMS Duration: uncontrolled Symptom severity: severe Hot flashes: yes Night sweats: yes Sleep disturbances: no Vaginal dryness: no Dyspareunia:no Decreased libido: no Emotional lability: yes Stress incontinence: no Previous HRT/pharmacotherapy: no Hysterectomy: no Absolute Contraindications to Hormonal Therapy:     Undiagnosed vaginal bleeding: no    Family History of Breast cancer: yes    Endometrial cancer: no    Coronary disease: no    Cerebrovascular disease: no    Venous thromboembolic disease: no  Menopausal Symptoms: yes  Depression Screen done today and results listed below:  Depression screen [facility] 2/9 03/22/2020 02/17/2018 07/30/2016 07/26/2015  Decreased Interest 0 0 0 0  Down, Depressed, Hopeless 0 0 0 0  PHQ - 2 Score 0 0 0 0  Altered sleeping 0 - - -  Tired, decreased energy 0 - - -  Change in appetite 0 - - -  Feeling bad or failure about yourself  0 - - -  Trouble concentrating 0 - - -  Moving slowly or fidgety/restless 0 - - -  Suicidal thoughts 0 - - -  PHQ-9 Score 0 - - -  Difficult doing work/chores Not difficult at all - - -    Past Medical History:  Past Medical History:  Diagnosis Date  . Mammographic microcalcification     Surgical History:  Past Surgical History:  Procedure Laterality Date  . BREAST BIOPSY Left 2013   neg  . NO PAST SURGERIES      Medications:  Current Outpatient Medications on File Prior to Visit  Medication Sig  . Coenzyme Q10 (CO Q  10 PO) Take by mouth.  2014 GARLIC PO Take 1 tablet by mouth daily.  . Multiple Vitamins-Minerals (MULTIVITAL PO) Take 1 tablet by mouth daily.  . Red Yeast Rice Extract (RED YEAST RICE PO) Take by mouth.  . Turmeric (QC TUMERIC COMPLEX PO) Take 1 tablet by mouth daily.   No current facility-administered medications on file prior to visit.    Allergies:  No Known Allergies  Social History:  Social History   Socioeconomic History  . Marital status: Married    Spouse name: Not on file  . Number of children: Not on file  . Years of education: Not on file  . Highest education level: Not on file  Occupational History  . Not on file  Tobacco Use  . Smoking status: Never Smoker  . Smokeless tobacco: Never Used  Vaping Use  . Vaping Use: Never used  Substance and Sexual Activity  . Alcohol use: No  . Drug use: No  . Sexual activity: Not Currently  Other Topics Concern  . Not on file  Social History Narrative  . Not on file   Social Determinants of Health   Financial Resource Strain:   . Difficulty of Paying Living Expenses: Not on file  Food Insecurity:   . Worried About Marland Kitchen in the Last Year: Not on  file  . Ran Out of Food in the Last Year: Not on file  Transportation Needs:   . Lack of Transportation (Medical): Not on file  . Lack of Transportation (Non-Medical): Not on file  Physical Activity:   . Days of Exercise per Week: Not on file  . Minutes of Exercise per Session: Not on file  Stress:   . Feeling of Stress : Not on file  Social Connections:   . Frequency of Communication with Friends and Family: Not on file  . Frequency of Social Gatherings with Friends and Family: Not on file  . Attends Religious Services: Not on file  . Active Member of Clubs or Organizations: Not on file  . Attends Banker Meetings: Not on file  . Marital Status: Not on file  Intimate Partner Violence:   . Fear of Current or Ex-Partner: Not on file  .  Emotionally Abused: Not on file  . Physically Abused: Not on file  . Sexually Abused: Not on file   Social History   Tobacco Use  Smoking Status Never Smoker  Smokeless Tobacco Never Used   Social History   Substance and Sexual Activity  Alcohol Use No    Family History:  Family History  Problem Relation Age of Onset  . Breast cancer Mother 35       BRACA NEG  . Cancer Mother        breast  . Ulcerative colitis Sister   . Diabetes Paternal Grandmother   . Hypertension Paternal Grandmother   . Breast cancer Maternal Aunt 60  . Breast cancer Paternal Aunt 42    Past medical history, surgical history, medications, allergies, family history and social history reviewed with patient today and changes made to appropriate areas of the chart.   Review of Systems  Constitutional: Positive for diaphoresis. Negative for chills, fever, malaise/fatigue and weight loss.  HENT: Negative.   Eyes: Negative.   Respiratory: Negative.   Cardiovascular: Negative.   Gastrointestinal: Negative.   Genitourinary: Negative.   Musculoskeletal: Positive for back pain. Negative for falls, joint pain, myalgias and neck pain.  Skin: Negative.   Neurological: Negative.   Endo/Heme/Allergies: Negative.   Psychiatric/Behavioral: Negative.     All other ROS negative except what is listed above and in the HPI.      Objective:    BP 133/89 (BP Location: Right Arm, Patient Position: Sitting)   Pulse 74   Temp 98.2 F (36.8 C) (Oral)   Ht 5\' 5"  (1.651 m)   Wt 222 lb (100.7 kg)   LMP 04/15/2016 (Approximate)   SpO2 97%   BMI 36.94 kg/m   Wt Readings from Last 3 Encounters:  03/22/20 222 lb (100.7 kg)  08/07/18 208 lb (94.3 kg)  02/17/18 206 lb 12.8 oz (93.8 kg)    Physical Exam Vitals and nursing note reviewed.  Constitutional:      General: She is not in acute distress.    Appearance: Normal appearance. She is not ill-appearing, toxic-appearing or diaphoretic.  HENT:     Head:  Normocephalic and atraumatic.     Right Ear: Tympanic membrane, ear canal and external ear normal. There is no impacted cerumen.     Left Ear: Tympanic membrane, ear canal and external ear normal. There is no impacted cerumen.     Nose: Nose normal. No congestion or rhinorrhea.     Mouth/Throat:     Mouth: Mucous membranes are moist.     Pharynx: Oropharynx is clear.  No oropharyngeal exudate or posterior oropharyngeal erythema.  Eyes:     General: No scleral icterus.       Right eye: No discharge.        Left eye: No discharge.     Extraocular Movements: Extraocular movements intact.     Conjunctiva/sclera: Conjunctivae normal.     Pupils: Pupils are equal, round, and reactive to light.  Neck:     Vascular: No carotid bruit.  Cardiovascular:     Rate and Rhythm: Normal rate and regular rhythm.     Pulses: Normal pulses.     Heart sounds: No murmur heard.  No friction rub. No gallop.   Pulmonary:     Effort: Pulmonary effort is normal. No respiratory distress.     Breath sounds: Normal breath sounds. No stridor. No wheezing, rhonchi or rales.  Chest:     Chest wall: No tenderness.     Breasts:        Right: Normal. No swelling, bleeding, inverted nipple, mass, nipple discharge, skin change or tenderness.        Left: Normal. No swelling, bleeding, inverted nipple, mass, nipple discharge, skin change or tenderness.  Abdominal:     General: Abdomen is flat. Bowel sounds are normal. There is no distension.     Palpations: Abdomen is soft. There is no mass.     Tenderness: There is no abdominal tenderness. There is no right CVA tenderness, left CVA tenderness, guarding or rebound.     Hernia: No hernia is present.  Genitourinary:    Comments: Pelvic exams deferred with shared decision making Musculoskeletal:        General: No swelling, tenderness, deformity or signs of injury.     Cervical back: Normal range of motion and neck supple. No rigidity. No muscular tenderness.     Right  lower leg: No edema.     Left lower leg: No edema.  Lymphadenopathy:     Cervical: No cervical adenopathy.  Skin:    General: Skin is warm and dry.     Capillary Refill: Capillary refill takes less than 2 seconds.     Coloration: Skin is not jaundiced or pale.     Findings: No bruising, erythema, lesion or rash.  Neurological:     General: No focal deficit present.     Mental Status: She is alert and oriented to person, place, and time. Mental status is at baseline.     Cranial Nerves: No cranial nerve deficit.     Sensory: No sensory deficit.     Motor: No weakness.     Coordination: Coordination normal.     Gait: Gait normal.     Deep Tendon Reflexes: Reflexes normal.  Psychiatric:        Mood and Affect: Mood normal.        Behavior: Behavior normal.        Thought Content: Thought content normal.        Judgment: Judgment normal.     Results for orders placed or performed in visit on 03/22/20  CBC with Differential/Platelet  Result Value Ref Range   WBC 4.2 3.4 - 10.8 x10E3/uL   RBC 5.28 3.77 - 5.28 x10E6/uL   Hemoglobin 13.7 11.1 - 15.9 g/dL   Hematocrit 96.0 45.4 - 46.6 %   MCV 81 79 - 97 fL   MCH 25.9 (L) 26.6 - 33.0 pg   MCHC 31.9 31 - 35 g/dL   RDW 09.8 11.9 - 14.7 %  Platelets 246 150 - 450 x10E3/uL   Neutrophils 52 Not Estab. %   Lymphs 36 Not Estab. %   Monocytes 10 Not Estab. %   Eos 1 Not Estab. %   Basos 1 Not Estab. %   Neutrophils Absolute 2.2 1 - 7 x10E3/uL   Lymphocytes Absolute 1.5 0 - 3 x10E3/uL   Monocytes Absolute 0.4 0 - 0 x10E3/uL   EOS (ABSOLUTE) 0.0 0.0 - 0.4 x10E3/uL   Basophils Absolute 0.0 0 - 0 x10E3/uL   Immature Granulocytes 0 Not Estab. %   Immature Grans (Abs) 0.0 0.0 - 0.1 x10E3/uL  Comprehensive metabolic panel  Result Value Ref Range   Glucose 80 65 - 99 mg/dL   BUN 11 6 - 24 mg/dL   Creatinine, Ser 2.42 0.57 - 1.00 mg/dL   GFR calc non Af Amer 78 >59 mL/min/1.73   GFR calc Af Amer 90 >59 mL/min/1.73   BUN/Creatinine  Ratio 13 9 - 23   Sodium 139 134 - 144 mmol/L   Potassium 4.2 3.5 - 5.2 mmol/L   Chloride 101 96 - 106 mmol/L   CO2 22 20 - 29 mmol/L   Calcium 9.9 8.7 - 10.2 mg/dL   Total Protein 7.4 6.0 - 8.5 g/dL   Albumin 4.7 3.8 - 4.8 g/dL   Globulin, Total 2.7 1.5 - 4.5 g/dL   Albumin/Globulin Ratio 1.7 1.2 - 2.2   Bilirubin Total 0.4 0.0 - 1.2 mg/dL   Alkaline Phosphatase 82 44 - 121 IU/L   AST 20 0 - 40 IU/L   ALT 14 0 - 32 IU/L  Lipid Panel w/o Chol/HDL Ratio  Result Value Ref Range   Cholesterol, Total 288 (H) 100 - 199 mg/dL   Triglycerides 78 0 - 149 mg/dL   HDL 69 >35 mg/dL   VLDL Cholesterol Cal 12 5 - 40 mg/dL   LDL Chol Calc (NIH) 361 (H) 0 - 99 mg/dL  Urinalysis, Routine w reflex microscopic  Result Value Ref Range   Specific Gravity, UA 1.010 1.005 - 1.030   pH, UA 6.0 5.0 - 7.5   Color, UA Yellow Yellow   Appearance Ur Clear Clear   Leukocytes,UA Negative Negative   Protein,UA Negative Negative/Trace   Glucose, UA Negative Negative   Ketones, UA Negative Negative   RBC, UA Negative Negative   Bilirubin, UA Negative Negative   Urobilinogen, Ur 0.2 0.2 - 1.0 mg/dL   Nitrite, UA Negative Negative  Thyroid Panel With TSH  Result Value Ref Range   TSH 2.030 0.450 - 4.500 uIU/mL   T4, Total 4.9 4.5 - 12.0 ug/dL   T3 Uptake Ratio 31 24 - 39 %   Free Thyroxine Index 1.5 1.2 - 4.9  Hepatitis C antibody  Result Value Ref Range   Hep C Virus Ab <0.1 0.0 - 0.9 s/co ratio      Assessment & Plan:   Problem List Items Addressed This Visit      Other   Hot flashes, menopausal    Will start OTC meds if not improving, will try medicine. Continue to monitor.       Hyperlipidemia    Rechecking levels today. Await results. Treat as needed.       Relevant Orders   Comprehensive metabolic panel (Completed)   Lipid Panel w/o Chol/HDL Ratio (Completed)   Class 2 obesity due to excess calories without serious comorbidity with body mass index (BMI) of 36.0 to 36.9 in adult     Would like to  see nutrition, but would like to speak to someone who's more up to date. [facility] Referral generated today.      Relevant Orders   Amb ref to Medical Nutrition Therapy-MNT    Other Visit Diagnoses    Routine general medical examination at a health care facility    -  Primary   Vaccines up to date. Screening labs checked today. Pap up to date. Mammogram done today. Continue diet and exercise. Call with any concerns.    Relevant Orders   CBC with Differential/Platelet (Completed)   Urinalysis, Routine w reflex microscopic (Completed)   Thyroid Panel With TSH (Completed)   Hepatitis C Antibody   Screening for colon cancer       Referral to GI made today.   Relevant Orders   Ambulatory referral to Gastroenterology   Encounter for screening mammogram for malignant neoplasm of breast       Mammogram ordered today.    Relevant Orders   MM 3D SCREEN BREAST BILATERAL (Completed)       Follow up plan: Return in about 4 weeks (around 04/19/2020) for follow up gabapentin.   LABORATORY TESTING:  - Pap smear: up to date  IMMUNIZATIONS:   - Tdap: Tetanus vaccination status reviewed: last tetanus booster within 10 years. - Influenza: Refused - COVID: Up to date  SCREENING: -Mammogram: Ordered today  - Colonoscopy: Ordered today   PATIENT COUNSELING:   Advised to take 1 mg of folate supplement per day if capable of pregnancy.   Sexuality: Discussed sexually transmitted diseases, partner selection, use of condoms, avoidance of unintended pregnancy  and contraceptive alternatives.   Advised to avoid cigarette smoking.  I discussed with the patient that most people either abstain from alcohol or drink within safe limits (<=14/week and <=4 drinks/occasion for males, <=7/weeks and <= 3 drinks/occasion for females) and that the risk for alcohol disorders and other health effects rises proportionally with the number of drinks per week and how often a  drinker exceeds daily limits.  Discussed cessation/primary prevention of drug use and availability of treatment for abuse.   Diet: Encouraged to adjust caloric intake to maintain  or achieve ideal body weight, to reduce intake of dietary saturated fat and total fat, to limit sodium intake by avoiding high sodium foods and not adding table salt, and to maintain adequate dietary potassium and calcium preferably from fresh fruits, vegetables, and low-fat dairy products.    stressed the importance of regular exercise  Injury prevention: Discussed safety belts, safety helmets, smoke detector, smoking near bedding or upholstery.   Dental health: Discussed importance of regular tooth brushing, flossing, and dental visits.    NEXT PREVENTATIVE PHYSICAL DUE IN 1 YEAR. Return in about 4 weeks (around 04/19/2020) for follow up gabapentin.

## 2020-03-22 NOTE — Patient Instructions (Addendum)
Black Cohash Evening Primrose Oil [place]   Low Back Sprain or Strain Rehab Ask your health care provider which exercises are safe for you. Do exercises exactly as told by your health care provider and adjust them as directed. It is normal to feel mild stretching, pulling, tightness, or discomfort as you do these exercises. Stop right away if you feel sudden pain or your pain gets worse. Do not begin these exercises until told by your health care provider. Stretching and range-of-motion exercises These exercises warm up your muscles and joints and improve the movement and flexibility of your back. These exercises also help to relieve pain, numbness, and tingling. Lumbar rotation  1. Lie on your back on a firm surface and bend your knees. 2. Straighten your arms out to your sides so each arm forms a 90-degree angle (right angle) with a side of your body. 3. Slowly move (rotate) both of your knees to one side of your body until you feel a stretch in your lower back (lumbar). Try not to let your shoulders lift off the floor. 4. Hold this position for __________ seconds. 5. Tense your abdominal muscles and slowly move your knees back to the starting position. 6. Repeat this exercise on the other side of your body. Repeat __________ times. Complete this exercise __________ times a day. Single knee to chest  1. Lie on your back on a firm surface with both legs straight. 2. Bend one of your knees. Use your hands to move your knee up toward your chest until you feel a gentle stretch in your lower back and buttock. ? Hold your leg in this position by holding on to the front of your knee. ? Keep your other leg as straight as possible. 3. Hold this position for __________ seconds. 4. Slowly return to the starting position. 5. Repeat with your other leg. Repeat __________ times. Complete this exercise __________ times a day. Prone extension on elbows  1. Lie on your abdomen on a firm surface  (prone position). 2. Prop yourself up on your elbows. 3. Use your arms to help lift your chest up until you feel a gentle stretch in your abdomen and your lower back. ? This will place some of your body weight on your elbows. If this is uncomfortable, try stacking pillows under your chest. ? Your hips should stay down, against the surface that you are lying on. Keep your hip and back muscles relaxed. 4. Hold this position for __________ seconds. 5. Slowly relax your upper body and return to the starting position. Repeat __________ times. Complete this exercise __________ times a day. Strengthening exercises These exercises build strength and endurance in your back. Endurance is the ability to use your muscles for a long time, even after they get tired. Pelvic tilt This exercise strengthens the muscles that lie deep in the abdomen. 1. Lie on your back on a firm surface. Bend your knees and keep your feet flat on the floor. 2. Tense your abdominal muscles. Tip your pelvis up toward the ceiling and flatten your lower back into the floor. ? To help with this exercise, you may place a small towel under your lower back and try to push your back into the towel. 3. Hold this position for __________ seconds. 4. Let your muscles relax completely before you repeat this exercise. Repeat __________ times. Complete this exercise __________ times a day. Alternating arm and leg raises  1. Get on your hands and knees on a firm surface.  If you are on a hard floor, you may want to use padding, such as an exercise mat, to cushion your knees. 2. Line up your arms and legs. Your hands should be directly below your shoulders, and your knees should be directly below your hips. 3. Lift your left leg behind you. At the same time, raise your right arm and straighten it in front of you. ? Do not lift your leg higher than your hip. ? Do not lift your arm higher than your shoulder. ? Keep your abdominal and back muscles  tight. ? Keep your hips facing the ground. ? Do not arch your back. ? Keep your balance carefully, and do not hold your breath. 4. Hold this position for __________ seconds. 5. Slowly return to the starting position. 6. Repeat with your right leg and your left arm. Repeat __________ times. Complete this exercise __________ times a day. Abdominal set with straight leg raise  1. Lie on your back on a firm surface. 2. Bend one of your knees and keep your other leg straight. 3. Tense your abdominal muscles and lift your straight leg up, 4-6 inches (10-15 cm) off the ground. 4. Keep your abdominal muscles tight and hold this position for __________ seconds. ? Do not hold your breath. ? Do not arch your back. Keep it flat against the ground. 5. Keep your abdominal muscles tense as you slowly lower your leg back to the starting position. 6. Repeat with your other leg. Repeat __________ times. Complete this exercise __________ times a day. Single leg lower with bent knees 1. Lie on your back on a firm surface. 2. Tense your abdominal muscles and lift your feet off the floor, one foot at a time, so your knees and hips are bent in 90-degree angles (right angles). ? Your knees should be over your hips and your lower legs should be parallel to the floor. 3. Keeping your abdominal muscles tense and your knee bent, slowly lower one of your legs so your toe touches the ground. 4. Lift your leg back up to return to the starting position. ? Do not hold your breath. ? Do not let your back arch. Keep your back flat against the ground. 5. Repeat with your other leg. Repeat __________ times. Complete this exercise __________ times a day. Posture and body mechanics Good posture and healthy body mechanics can help to relieve stress in your body's tissues and joints. Body mechanics refers to the movements and positions of your body while you do your daily activities. Posture is part of body mechanics. Good  posture means:  Your spine is in its natural S-curve position (neutral).  Your shoulders are pulled back slightly.  Your head is not tipped forward. Follow these guidelines to improve your posture and body mechanics in your everyday activities. Standing   When standing, keep your spine neutral and your feet about hip width apart. Keep a slight bend in your knees. Your ears, shoulders, and hips should line up.  When you do a task in which you stand in one place for a long time, place one foot up on a stable object that is 2-4 inches (5-10 cm) high, such as a footstool. This helps keep your spine neutral. Sitting   When sitting, keep your spine neutral and keep your feet flat on the floor. Use a footrest, if necessary, and keep your thighs parallel to the floor. Avoid rounding your shoulders, and avoid tilting your head forward.  When working at  a desk or a computer, keep your desk at a height where your hands are slightly lower than your elbows. Slide your chair under your desk so you are close enough to maintain good posture.  When working at a computer, place your monitor at a height where you are looking straight ahead and you do not have to tilt your head forward or downward to look at the screen. Resting  When lying down and resting, avoid positions that are most painful for you.  If you have pain with activities such as sitting, bending, stooping, or squatting, lie in a position in which your body does not bend very much. For example, avoid curling up on your side with your arms and knees near your chest (fetal position).  If you have pain with activities such as standing for a long time or reaching with your arms, lie with your spine in a neutral position and bend your knees slightly. Try the following positions: ? Lying on your side with a pillow between your knees. ? Lying on your back with a pillow under your knees. Lifting   When lifting objects, keep your feet at least  shoulder width apart and tighten your abdominal muscles.  Bend your knees and hips and keep your spine neutral. It is important to lift using the strength of your legs, not your back. Do not lock your knees straight out.  Always ask for help to lift heavy or awkward objects. This information is not intended to replace advice given to you by your health care provider. Make sure you discuss any questions you have with your health care provider. Document Revised: 10/16/2018 Document Reviewed: 07/16/2018 Elsevier Patient Education  2020 [employer].

## 2020-03-23 LAB — THYROID PANEL WITH TSH
Free Thyroxine Index: 1.5 (ref 1.2–4.9)
T3 Uptake Ratio: 31 % (ref 24–39)
T4, Total: 4.9 ug/dL (ref 4.5–12.0)
TSH: 2.03 u[IU]/mL (ref 0.450–4.500)

## 2020-03-23 LAB — COMPREHENSIVE METABOLIC PANEL
ALT: 14 IU/L (ref 0–32)
AST: 20 IU/L (ref 0–40)
Albumin/Globulin Ratio: 1.7 (ref 1.2–2.2)
Albumin: 4.7 g/dL (ref 3.8–4.8)
Alkaline Phosphatase: 82 IU/L (ref 44–121)
BUN/Creatinine Ratio: 13 (ref 9–23)
BUN: 11 mg/dL (ref 6–24)
Bilirubin Total: 0.4 mg/dL (ref 0.0–1.2)
CO2: 22 mmol/L (ref 20–29)
Calcium: 9.9 mg/dL (ref 8.7–10.2)
Chloride: 101 mmol/L (ref 96–106)
Creatinine, Ser: 0.88 mg/dL (ref 0.57–1.00)
GFR calc Af Amer: 90 mL/min/{1.73_m2} (ref >59)
GFR calc non Af Amer: 78 mL/min/{1.73_m2} (ref >59)
Globulin, Total: 2.7 g/dL (ref 1.5–4.5)
Glucose: 80 mg/dL (ref 65–99)
Potassium: 4.2 mmol/L (ref 3.5–5.2)
Sodium: 139 mmol/L (ref 134–144)
Total Protein: 7.4 g/dL (ref 6.0–8.5)

## 2020-03-23 LAB — CBC WITH DIFFERENTIAL/PLATELET
Basophils Absolute: 0 10*3/uL (ref 0.0–0.2)
Basos: 1 %
EOS (ABSOLUTE): 0 10*3/uL (ref 0.0–0.4)
Eos: 1 %
Hematocrit: 42.9 % (ref 34.0–46.6)
Hemoglobin: 13.7 g/dL (ref 11.1–15.9)
Immature Grans (Abs): 0 10*3/uL (ref 0.0–0.1)
Immature Granulocytes: 0 %
Lymphocytes Absolute: 1.5 10*3/uL (ref 0.7–3.1)
Lymphs: 36 %
MCH: 25.9 pg — ABNORMAL LOW (ref 26.6–33.0)
MCHC: 31.9 g/dL (ref 31.5–35.7)
MCV: 81 fL (ref 79–97)
Monocytes Absolute: 0.4 10*3/uL (ref 0.1–0.9)
Monocytes: 10 %
Neutrophils Absolute: 2.2 10*3/uL (ref 1.4–7.0)
Neutrophils: 52 %
Platelets: 246 10*3/uL (ref 150–450)
RBC: 5.28 x10E6/uL (ref 3.77–5.28)
RDW: 13.5 % (ref 11.7–15.4)
WBC: 4.2 10*3/uL (ref 3.4–10.8)

## 2020-03-23 LAB — HEPATITIS C ANTIBODY: Hep C Virus Ab: <0.1 s/co ratio (ref 0.0–0.9)

## 2020-03-23 LAB — LIPID PANEL W/O CHOL/HDL RATIO
Cholesterol, Total: 288 mg/dL — ABNORMAL HIGH (ref 100–199)
HDL: 69 mg/dL (ref >39)
LDL Chol Calc (NIH): 207 mg/dL — ABNORMAL HIGH (ref 0–99)
Triglycerides: 78 mg/dL (ref 0–149)
VLDL Cholesterol Cal: 12 mg/dL (ref 5–40)

## 2020-03-26 DIAGNOSIS — E6609 Other obesity due to excess calories: Secondary | ICD-10-CM | POA: Insufficient documentation

## 2020-03-26 DIAGNOSIS — Z6836 Body mass index (BMI) 36.0-36.9, adult: Secondary | ICD-10-CM | POA: Insufficient documentation

## 2020-03-26 NOTE — Assessment & Plan Note (Signed)
Will start OTC meds if not improving, will try medicine. Continue to monitor.

## 2020-03-26 NOTE — Assessment & Plan Note (Signed)
Would like to see nutrition, but would like to speak to someone who's more up to date. Would be interested in seeing [facility]. Referral generated today.

## 2020-03-26 NOTE — Assessment & Plan Note (Signed)
Rechecking levels today. Await results. Treat as needed.  

## 2020-04-10 ENCOUNTER — Telehealth (INDEPENDENT_AMBULATORY_CARE_PROVIDER_SITE_OTHER): Payer: Self-pay | Admitting: Gastroenterology

## 2020-04-10 ENCOUNTER — Other Ambulatory Visit: Payer: Self-pay

## 2020-04-10 DIAGNOSIS — Z1211 Encounter for screening for malignant neoplasm of colon: Secondary | ICD-10-CM

## 2020-04-10 MED ORDER — PEG 3350-KCL-NA BICARB-NACL 420 G PO SOLR: 4000.0000 mL | Freq: Once | ORAL | 0 refills | Status: AC

## 2020-04-10 NOTE — Progress Notes (Signed)
Gastroenterology Pre-Procedure Review  Request Date: Wed 04/26/20 Requesting Physician: Dr. Allegra Lai  PATIENT REVIEW QUESTIONS: The patient responded to the following health history questions as indicated:    1. Are you having any GI issues? no 2. Do you have a personal history of Polyps? no 3. Do you have a family history of Colon Cancer or Polyps? no 4. Diabetes Mellitus? no 5. Joint replacements in the past 12 months?no 6. Major health problems in the past 3 months?no 7. Any artificial heart valves, MVP, or defibrillator?no    MEDICATIONS & ALLERGIES:    Patient reports the following regarding taking any anticoagulation/antiplatelet therapy:   Plavix, Coumadin, Eliquis, Xarelto, Lovenox, Pradaxa, Brilinta, or Effient? no Aspirin? no  Patient confirms/reports the following medications:  Current Outpatient Medications  Medication Sig Dispense Refill  . Coenzyme Q10 (CO Q 10 PO) Take by mouth.    . gabapentin (NEURONTIN) 100 MG capsule Take 1 capsule (100 mg total) by mouth 3 (three) times daily. 90 capsule 3  . GARLIC PO Take 1 tablet by mouth daily.    . Multiple Vitamins-Minerals (MULTIVITAL PO) Take 1 tablet by mouth daily.    . Red Yeast Rice Extract (RED YEAST RICE PO) Take by mouth.    . Turmeric (QC TUMERIC COMPLEX PO) Take 1 tablet by mouth daily.     No current facility-administered medications for this visit.    Patient confirms/reports the following allergies:  No Known Allergies  No orders of the defined types were placed in this encounter.   AUTHORIZATION INFORMATION Primary Insurance: 1D#: Group #:  Secondary Insurance: 1D#: Group #:  SCHEDULE INFORMATION: Date: Wed 04/26/20 Time: Location:[facility]

## 2020-04-19 ENCOUNTER — Other Ambulatory Visit: Payer: Self-pay

## 2020-04-19 ENCOUNTER — Encounter: Payer: Self-pay | Admitting: Family Medicine

## 2020-04-19 ENCOUNTER — Ambulatory Visit (INDEPENDENT_AMBULATORY_CARE_PROVIDER_SITE_OTHER): Payer: Managed Care, Other (non HMO) | Admitting: Family Medicine

## 2020-04-19 VITALS — BP 127/75 | HR 79 | Temp 98.1°F | Wt 231.8 lb

## 2020-04-19 DIAGNOSIS — E785 Hyperlipidemia, unspecified: Secondary | ICD-10-CM

## 2020-04-19 DIAGNOSIS — N951 Menopausal and female climacteric states: Secondary | ICD-10-CM | POA: Diagnosis not present

## 2020-04-19 NOTE — Assessment & Plan Note (Signed)
Tolerating them OK. No better with gabapentin. Does not want to try hormones. Continue to monitor. Call with any concerns.

## 2020-04-19 NOTE — Progress Notes (Signed)
BP 127/75   Pulse 79   Temp 98.1 F (36.7 C) (Oral)   Wt 231 lb 12.8 oz (105.1 kg)   LMP 04/15/2016 (Approximate)   SpO2 99%   BMI 38.57 kg/m    Subjective:    Patient ID: Jenna Fuller, female    DOB: 05/28/1971, 49 y.o.   MRN: [id number]  HPI: Jenna Fuller is a 49 y.o. female  Chief Complaint  Patient presents with  . Follow-up    pt states the gabapentin is not helping, states she has not noticed any difference since starting medication   MENOPAUSAL SYMPTOMS Duration: chronic Status: stable Symptom severity: severe Hot flashes: yes Night sweats: yes Sleep disturbances: no Vaginal dryness: no Dyspareunia:no Decreased libido: no Emotional lability: yes Stress incontinence: no Previous HRT/pharmacotherapy: no Hysterectomy: no Absolute Contraindications to Hormonal Therapy:     Undiagnosed vaginal bleeding: no    Family history of Breast cancer: yes    Endometrial cancer: no    Coronary disease: no    Cerebrovascular disease: no    Venous thromboembolic disease: no  Relevant past medical, surgical, family and social history reviewed and updated as indicated. Interim medical history since our last visit reviewed. Allergies and medications reviewed and updated.  Review of Systems  Constitutional: Positive for diaphoresis.  Respiratory: Negative.   Cardiovascular: Negative.   Gastrointestinal: Negative.   Musculoskeletal: Negative.   Psychiatric/Behavioral: Negative.     Per HPI unless specifically indicated above     Objective:    BP 127/75   Pulse 79   Temp 98.1 F (36.7 C) (Oral)   Wt 231 lb 12.8 oz (105.1 kg)   LMP 04/15/2016 (Approximate)   SpO2 99%   BMI 38.57 kg/m   Wt Readings from Last 3 Encounters:  04/19/20 231 lb 12.8 oz (105.1 kg)  03/22/20 222 lb (100.7 kg)  08/07/18 208 lb (94.3 kg)    Physical Exam Vitals and nursing note reviewed.  Constitutional:      General: She is not in acute distress.     Appearance: Normal appearance. She is not ill-appearing, toxic-appearing or diaphoretic.  HENT:     Head: Normocephalic and atraumatic.     Right Ear: External ear normal.     Left Ear: External ear normal.     Nose: Nose normal.     Mouth/Throat:     Mouth: Mucous membranes are moist.     Pharynx: Oropharynx is clear.  Eyes:     General: No scleral icterus.       Right eye: No discharge.        Left eye: No discharge.     Extraocular Movements: Extraocular movements intact.     Conjunctiva/sclera: Conjunctivae normal.     Pupils: Pupils are equal, round, and reactive to light.  Cardiovascular:     Rate and Rhythm: Normal rate and regular rhythm.     Pulses: Normal pulses.     Heart sounds: Normal heart sounds. No murmur heard.  No friction rub. No gallop.   Pulmonary:     Effort: Pulmonary effort is normal. No respiratory distress.     Breath sounds: Normal breath sounds. No stridor. No wheezing, rhonchi or rales.  Chest:     Chest wall: No tenderness.  Musculoskeletal:        General: Normal range of motion.     Cervical back: Normal range of motion and neck supple.  Skin:    General: Skin is warm and dry.  Capillary Refill: Capillary refill takes less than 2 seconds.     Coloration: Skin is not jaundiced or pale.     Findings: No bruising, erythema, lesion or rash.  Neurological:     General: No focal deficit present.     Mental Status: She is alert and oriented to person, place, and time. Mental status is at baseline.  Psychiatric:        Mood and Affect: Mood normal.        Behavior: Behavior normal.        Thought Content: Thought content normal.        Judgment: Judgment normal.     Results for orders placed or performed in visit on 03/22/20  CBC with Differential/Platelet  Result Value Ref Range   WBC 4.2 3.4 - 10.8 x10E3/uL   RBC 5.28 3.77 - 5.28 x10E6/uL   Hemoglobin 13.7 11.1 - 15.9 g/dL   Hematocrit 71.2 45.8 - 46.6 %   MCV 81 79 - 97 fL   MCH 25.9  (L) 26.6 - 33.0 pg   MCHC 31.9 31 - 35 g/dL   RDW 09.9 83.3 - 82.5 %   Platelets 246 150 - 450 x10E3/uL   Neutrophils 52 Not Estab. %   Lymphs 36 Not Estab. %   Monocytes 10 Not Estab. %   Eos 1 Not Estab. %   Basos 1 Not Estab. %   Neutrophils Absolute 2.2 1 - 7 x10E3/uL   Lymphocytes Absolute 1.5 0 - 3 x10E3/uL   Monocytes Absolute 0.4 0 - 0 x10E3/uL   EOS (ABSOLUTE) 0.0 0.0 - 0.4 x10E3/uL   Basophils Absolute 0.0 0 - 0 x10E3/uL   Immature Granulocytes 0 Not Estab. %   Immature Grans (Abs) 0.0 0.0 - 0.1 x10E3/uL  Comprehensive metabolic panel  Result Value Ref Range   Glucose 80 65 - 99 mg/dL   BUN 11 6 - 24 mg/dL   Creatinine, Ser 0.53 0.57 - 1.00 mg/dL   GFR calc non Af Amer 78 >59 mL/min/1.73   GFR calc Af Amer 90 >59 mL/min/1.73   BUN/Creatinine Ratio 13 9 - 23   Sodium 139 134 - 144 mmol/L   Potassium 4.2 3.5 - 5.2 mmol/L   Chloride 101 96 - 106 mmol/L   CO2 22 20 - 29 mmol/L   Calcium 9.9 8.7 - 10.2 mg/dL   Total Protein 7.4 6.0 - 8.5 g/dL   Albumin 4.7 3.8 - 4.8 g/dL   Globulin, Total 2.7 1.5 - 4.5 g/dL   Albumin/Globulin Ratio 1.7 1.2 - 2.2   Bilirubin Total 0.4 0.0 - 1.2 mg/dL   Alkaline Phosphatase 82 44 - 121 IU/L   AST 20 0 - 40 IU/L   ALT 14 0 - 32 IU/L  Lipid Panel w/o Chol/HDL Ratio  Result Value Ref Range   Cholesterol, Total 288 (H) 100 - 199 mg/dL   Triglycerides 78 0 - 149 mg/dL   HDL 69 >97 mg/dL   VLDL Cholesterol Cal 12 5 - 40 mg/dL   LDL Chol Calc (NIH) 673 (H) 0 - 99 mg/dL  Urinalysis, Routine w reflex microscopic  Result Value Ref Range   Specific Gravity, UA 1.010 1.005 - 1.030   pH, UA 6.0 5.0 - 7.5   Color, UA Yellow Yellow   Appearance Ur Clear Clear   Leukocytes,UA Negative Negative   Protein,UA Negative Negative/Trace   Glucose, UA Negative Negative   Ketones, UA Negative Negative   RBC, UA Negative Negative  Bilirubin, UA Negative Negative   Urobilinogen, Ur 0.2 0.2 - 1.0 mg/dL   Nitrite, UA Negative Negative  Thyroid Panel  With TSH  Result Value Ref Range   TSH 2.030 0.450 - 4.500 uIU/mL   T4, Total 4.9 4.5 - 12.0 ug/dL   T3 Uptake Ratio 31 24 - 39 %   Free Thyroxine Index 1.5 1.2 - 4.9  Hepatitis C antibody  Result Value Ref Range   Hep C Virus Ab <0.1 0.0 - 0.9 s/co ratio      Assessment & Plan:   Problem List Items Addressed This Visit      Cardiovascular and Mediastinum   Hot flashes, menopausal - Primary    Tolerating them OK. No better with gabapentin. Does not want to try hormones. Continue to monitor. Call with any concerns.         Other   Hyperlipidemia    Will recheck her labs in 6 months- she will call for where we should fax her orders. Orders in. Call with any concerns.       Relevant Orders   TSH   Lipid Panel w/o Chol/HDL Ratio   Comprehensive metabolic panel   CBC with Differential/Platelet       Follow up plan: Return September 2022- physical.

## 2020-04-19 NOTE — Assessment & Plan Note (Signed)
Will recheck her labs in 6 months- she will call for where we should fax her orders. Orders in. Call with any concerns.

## 2020-04-24 ENCOUNTER — Other Ambulatory Visit: Payer: Self-pay

## 2020-04-24 ENCOUNTER — Other Ambulatory Visit
Admission: RE | Admit: 2020-04-24 | Discharge: 2020-04-24 | Disposition: A | Discharge status: Home / Self Care | Payer: Managed Care, Other (non HMO) | Source: Ambulatory Visit | Attending: Gastroenterology | Admitting: Gastroenterology

## 2020-04-26 ENCOUNTER — Ambulatory Visit: Payer: Managed Care, Other (non HMO) | Admitting: Anesthesiology

## 2020-04-26 ENCOUNTER — Ambulatory Visit
Admission: RE | Admit: 2020-04-26 | Discharge: 2020-04-26 | Disposition: A | Discharge status: Home / Self Care | Payer: Managed Care, Other (non HMO) | Attending: Gastroenterology | Admitting: Gastroenterology

## 2020-04-26 ENCOUNTER — Encounter: Payer: Self-pay | Admitting: Gastroenterology

## 2020-04-26 ENCOUNTER — Inpatient Hospital Stay: Admit: 2020-04-26 | Payer: Managed Care, Other (non HMO)

## 2020-04-26 ENCOUNTER — Other Ambulatory Visit: Payer: Self-pay

## 2020-04-26 ENCOUNTER — Encounter
Admission: RE | Disposition: A | Discharge status: Home / Self Care | Payer: Self-pay | Source: Home / Self Care | Attending: Gastroenterology

## 2020-04-26 DIAGNOSIS — Z1211 Encounter for screening for malignant neoplasm of colon: Secondary | ICD-10-CM | POA: Insufficient documentation

## 2020-04-26 DIAGNOSIS — K635 Polyp of colon: Secondary | ICD-10-CM | POA: Insufficient documentation

## 2020-04-26 DIAGNOSIS — Z20822 Contact with and (suspected) exposure to covid-19: Secondary | ICD-10-CM | POA: Diagnosis not present

## 2020-04-26 HISTORY — PX: COLONOSCOPY WITH PROPOFOL: SHX5780

## 2020-04-26 LAB — SARS CORONAVIRUS 2 BY RT PCR (HOSPITAL ORDER, PERFORMED IN ~~LOC~~ HOSPITAL LAB): SARS Coronavirus 2: NEGATIVE

## 2020-04-26 SURGERY — COLONOSCOPY WITH PROPOFOL
Anesthesia: General

## 2020-04-26 MED ORDER — SODIUM CHLORIDE 0.9 % IV SOLN: INTRAVENOUS | Status: DC

## 2020-04-26 MED ORDER — MIDAZOLAM HCL 5 MG/5ML IJ SOLN
INTRAMUSCULAR | Status: DC | PRN
  Administered 2020-04-26: 2 mg via INTRAVENOUS

## 2020-04-26 MED ORDER — PROPOFOL 10 MG/ML IV BOLUS
INTRAVENOUS | Status: AC
  Filled 2020-04-26: qty 20

## 2020-04-26 MED ORDER — PROPOFOL 10 MG/ML IV BOLUS
INTRAVENOUS | Status: DC | PRN
  Administered 2020-04-26: 30 mg via INTRAVENOUS
  Administered 2020-04-26: 70 mg via INTRAVENOUS

## 2020-04-26 MED ORDER — PROPOFOL 500 MG/50ML IV EMUL
INTRAVENOUS | Status: AC
  Filled 2020-04-26: qty 100

## 2020-04-26 MED ORDER — LIDOCAINE 2% (20 MG/ML) 5 ML SYRINGE
INTRAMUSCULAR | Status: DC | PRN
  Administered 2020-04-26: 25 mg via INTRAVENOUS

## 2020-04-26 MED ORDER — MIDAZOLAM HCL 2 MG/2ML IJ SOLN
INTRAMUSCULAR | Status: AC
  Filled 2020-04-26: qty 2

## 2020-04-26 MED ORDER — EPHEDRINE 5 MG/ML INJ
INTRAVENOUS | Status: AC
  Filled 2020-04-26: qty 10

## 2020-04-26 MED ORDER — PROPOFOL 500 MG/50ML IV EMUL
INTRAVENOUS | Status: DC | PRN
  Administered 2020-04-26: 120 ug/kg/min via INTRAVENOUS

## 2020-04-26 NOTE — H&P (Signed)
Arlyss Repress, MD [address]  [address]  [place], [place] [address]  Main: [phone]  Fax: [phone] Pager: [phone]  Primary Care Physician:  Dorcas Carrow, DO Primary Gastroenterologist:  Dr. Arlyss Repress  Pre-Procedure History & Physical: HPI:  Jenna Fuller is a 49 y.o. female is here for an colonoscopy.   Past Medical History:  Diagnosis Date  . Mammographic microcalcification     Past Surgical History:  Procedure Laterality Date  . BREAST BIOPSY Left 2013   neg    Prior to Admission medications   Medication Sig Start Date End Date Taking? Authorizing Provider  Coenzyme Q10 (CO Q 10 PO) Take by mouth.   Yes [provider]  GARLIC PO Take 1 tablet by mouth daily.   Yes [provider]  Multiple Vitamins-Minerals (MULTIVITAL PO) Take 1 tablet by mouth daily.   Yes [provider]  Red Yeast Rice Extract (RED YEAST RICE PO) Take by mouth.   Yes [provider]  Turmeric (QC TUMERIC COMPLEX PO) Take 1 tablet by mouth daily.   Yes [provider]    Allergies as of 04/10/2020  . (No Known Allergies)    Family History  Problem Relation Age of Onset  . Breast cancer Mother 8       BRACA NEG  . Cancer Mother        breast  . Ulcerative colitis Sister   . Irritable bowel syndrome Sister   . Diabetes Paternal Grandmother   . Hypertension Paternal Grandmother   . Breast cancer Maternal Aunt 60  . Breast cancer Paternal Aunt 67  . Irritable bowel syndrome Paternal Uncle   . Irritable bowel syndrome Paternal Aunt     Social History   Socioeconomic History  . Marital status: Divorced    Spouse name: Not on file  . Number of children: Not on file  . Years of education: Not on file  . Highest education level: Not on file  Occupational History  . Not on file  Tobacco Use  . Smoking status: Never Smoker  . Smokeless tobacco: Never Used  Vaping Use  . Vaping Use: Never used    Substance and Sexual Activity  . Alcohol use: No  . Drug use: No  . Sexual activity: Not Currently  Other Topics Concern  . Not on file  Social History Narrative  . Not on file   Social Determinants of Health   Financial Resource Strain:   . Difficulty of Paying Living Expenses: Not on file  Food Insecurity:   . Worried About Programme researcher, broadcasting/film/video in the Last Year: Not on file  . Ran Out of Food in the Last Year: Not on file  Transportation Needs:   . Lack of Transportation (Medical): Not on file  . Lack of Transportation (Non-Medical): Not on file  Physical Activity:   . Days of Exercise per Week: Not on file  . Minutes of Exercise per Session: Not on file  Stress:   . Feeling of Stress : Not on file  Social Connections:   . Frequency of Communication with Friends and Family: Not on file  . Frequency of Social Gatherings with Friends and Family: Not on file  . Attends Religious Services: Not on file  . Active Member of Clubs or Organizations: Not on file  . Attends Banker Meetings: Not on file  . Marital Status: Not on file  Intimate Partner Violence:   .  Fear of Current or Ex-Partner: Not on file  . Emotionally Abused: Not on file  . Physically Abused: Not on file  . Sexually Abused: Not on file    Review of Systems: See HPI, otherwise negative ROS  Physical Exam: BP 123/74   Pulse 72   Temp (!) 97.1 F (36.2 C) (Temporal)   Resp 16   Ht 5\' 5"  (1.651 m)   Wt 99.8 kg   LMP 04/15/2016 (Approximate)   SpO2 100%   BMI 36.61 kg/m  General:   Alert,  pleasant and cooperative in NAD Head:  Normocephalic and atraumatic. Neck:  Supple; no masses or thyromegaly. Lungs:  Clear throughout to auscultation.    Heart:  Regular rate and rhythm. Abdomen:  Soft, nontender and nondistended. Normal bowel sounds, without guarding, and without rebound.   Neurologic:  Alert and  oriented x4;  grossly normal neurologically.  Impression/Plan: 06/15/2016 is here for an colonoscopy to be performed for colon cancer screening  Risks, benefits, limitations, and alternatives regarding  colonoscopy have been reviewed with the patient.  Questions have been answered.  All parties agreeable.   [name], MD  04/26/2020, 8:09 AM

## 2020-04-26 NOTE — Op Note (Signed)
[facility] Patient Name: Jenna Fuller Procedure Date: 04/26/2020 8:53 AM MRN: [id number] Account #: [id number] Date of Birth: Jun 05, 1971 Admit Type: Outpatient Age: 49 Room: [facility] ENDO ROOM 4 Gender: Female Note Status: Finalized Procedure:             Colonoscopy Indications:           Screening for colorectal malignant neoplasm, This is                         the patient's first colonoscopy Providers:             Toney Reil MD, MD Referring MD:          Dorcas Carrow (Referring MD) Medicines:             General Anesthesia Complications:         No immediate complications. Estimated blood loss: None. Procedure:             Pre-Anesthesia Assessment:                        - Prior to the procedure, a History and Physical was                         performed, and patient medications and allergies were                         reviewed. The patient is competent. The risks and                         benefits of the procedure and the sedation options and                         risks were discussed with the patient. All questions                         were answered and informed consent was obtained.                         Patient identification and proposed procedure were                         verified by the physician, the nurse, the                         anesthesiologist, the anesthetist and the technician                         in the pre-procedure area in the procedure room in the                         endoscopy suite. Mental Status Examination: alert and                         oriented. Airway Examination: normal oropharyngeal                         airway and neck mobility. Respiratory Examination:  clear to auscultation. CV Examination: normal.                         Prophylactic Antibiotics: The patient does not require                         prophylactic antibiotics. Prior  Anticoagulants: The                         patient has taken no previous anticoagulant or                         antiplatelet agents. ASA Grade Assessment: II - A                         patient with mild systemic disease. After reviewing                         the risks and benefits, the patient was deemed in                         satisfactory condition to undergo the procedure. The                         anesthesia plan was to use general anesthesia.                         Immediately prior to administration of medications,                         the patient was re-assessed for adequacy to receive                         sedatives. The heart rate, respiratory rate, oxygen                         saturations, blood pressure, adequacy of pulmonary                         ventilation, and response to care were monitored                         throughout the procedure. The physical status of the                         patient was re-assessed after the procedure.                        After obtaining informed consent, the colonoscope was                         passed under direct vision. Throughout the procedure,                         the patient's blood pressure, pulse, and oxygen                         saturations were monitored continuously. The  Colonoscope was introduced through the anus and                         advanced to the the cecum, identified by appendiceal                         orifice and ileocecal valve. The colonoscopy was                         unusually difficult due to significant looping and the                         patient's body habitus. Successful completion of the                         procedure was aided by applying abdominal pressure.                         The patient tolerated the procedure well. The quality                         of the bowel preparation was evaluated using the BBPS                         [facility]  Bowel Preparation Scale) with scores of: Right                         Colon = 3, Transverse Colon = 3 and Left Colon = 3                         (entire mucosa seen well with no residual staining,                         small fragments of stool or opaque liquid). The total                         BBPS score equals 9. Findings:      The perianal and digital rectal examinations were normal. Pertinent       negatives include normal sphincter tone and no palpable rectal lesions.      Two sessile polyps were found in the transverse colon. The polyps were 3       to 4 mm in size. These polyps were removed with a cold snare. Resection       and retrieval were complete.      The retroflexed view of the distal rectum and anal verge was normal and       showed no anal or rectal abnormalities. Impression:            - Two 3 to 4 mm polyps in the transverse colon,                         removed with a cold snare. Resected and retrieved.                        - The distal rectum and anal verge are normal on  retroflexion view. Recommendation:        - Discharge patient to home (with escort).                        - Resume previous diet today.                        - Continue present medications.                        - Await pathology results.                        - Repeat colonoscopy in 7 years for surveillance based                         on pathology results. Procedure Code(s):     --- Professional ---                        [phone], Colonoscopy, flexible; with removal of                         tumor(s), polyp(s), or other lesion(s) by snare                         technique Diagnosis Code(s):     --- Professional ---                        Z12.11, Encounter for screening for malignant neoplasm                         of colon                        K63.5, Polyp of colon CPT copyright 2019 American Medical Association. All rights reserved. The codes documented in  this report are preliminary and upon coder review may  be revised to meet current compliance requirements. Dr. Libby Maw Toney Reil MD, MD 04/26/2020 12:02:56 PM This report has been signed electronically. Number of Addenda: 0 Note Initiated On: 04/26/2020 8:53 AM Scope Withdrawal Time: 0 hours 18 minutes 16 seconds  Total Procedure Duration: 0 hours 29 minutes 19 seconds  Estimated Blood Loss:  Estimated blood loss: none.      [facility]

## 2020-04-26 NOTE — Progress Notes (Signed)
   04/26/20 0755  Clinical Encounter Type  Visited With Patient and family together  Visit Type Initial  Referral From Chaplain  Consult/Referral To Chaplain  While rounding [facility] waiting area, chaplain visited with Pt and her mother. Chaplain commented on how beautiful their names were. While talking staff came out to get Pt and chaplain and Pt's mother continued talking.  During the conversation Pt's mother mentioned that she is a Jehovah Witness. That knowledge did not cause chaplain to change the course of the conversation and she did not change her attitude toward Pt's mother. They had a good visit.

## 2020-04-26 NOTE — Transfer of Care (Signed)
Immediate Anesthesia Transfer of Care Note  Patient: Jenna Fuller  Procedure(s) Performed: COLONOSCOPY WITH PROPOFOL (N/A )  Patient Location: PACU  Anesthesia Type:General  Level of Consciousness: drowsy  Airway & Oxygen Therapy: Patient Spontanous Breathing  Post-op Assessment: Report given to RN and Post -op Vital signs reviewed and stable  Post vital signs: Reviewed and stable  Last Vitals:  Vitals Value Taken Time  BP 137/96   Temp 36.3 C   Pulse 79   Resp 19   SpO2 100 %     Last Pain:  Vitals:   04/26/20 0803  TempSrc: Temporal  PainSc: 0-No pain         Complications: No complications documented.

## 2020-04-26 NOTE — Anesthesia Postprocedure Evaluation (Signed)
Anesthesia Post Note  Patient: Jenna Fuller  Procedure(s) Performed: COLONOSCOPY WITH PROPOFOL (N/A )  Patient location during evaluation: Endoscopy Anesthesia Type: General Level of consciousness: awake and alert and oriented Pain management: pain level controlled Vital Signs Assessment: post-procedure vital signs reviewed and stable Respiratory status: spontaneous breathing, nonlabored ventilation and respiratory function stable Cardiovascular status: blood pressure returned to baseline and stable Postop Assessment: no signs of nausea or vomiting Anesthetic complications: no   No complications documented.   Last Vitals:  Vitals:   04/26/20 1225 04/26/20 1235  BP: 118/77 113/73  Pulse: 65 65  Resp: 17 13  Temp:    SpO2: 100% 100%    Last Pain:  Vitals:   04/26/20 1235  TempSrc:   PainSc: 0-No pain                 Anah Billard

## 2020-04-26 NOTE — Anesthesia Preprocedure Evaluation (Signed)
Anesthesia Evaluation  Patient identified by MRN, date of birth, ID band Patient awake    Reviewed: Allergy & Precautions, NPO status , Patient's Chart, lab work & pertinent test results  History of Anesthesia Complications Negative for: history of anesthetic complications  Airway Mallampati: II  TM Distance: >3 FB Neck ROM: Full    Dental no notable dental hx.    Pulmonary neg pulmonary ROS, neg sleep apnea, neg COPD,    breath sounds clear to auscultation- rhonchi (-) wheezing      Cardiovascular Exercise Tolerance: Good (-) hypertension(-) CAD and (-) Past MI  Rhythm:Regular Rate:Normal - Systolic murmurs and - Diastolic murmurs    Neuro/Psych negative neurological ROS  negative psych ROS   GI/Hepatic negative GI ROS, Neg liver ROS,   Endo/Other  negative endocrine ROSneg diabetes  Renal/GU negative Renal ROS     Musculoskeletal negative musculoskeletal ROS (+)   Abdominal (+) + obese,   Peds  Hematology negative hematology ROS (+)   Anesthesia Other Findings   Reproductive/Obstetrics                             Anesthesia Physical Anesthesia Plan  ASA: II  Anesthesia Plan: General   Post-op Pain Management:    Induction: Intravenous  PONV Risk Score and Plan: 2 and Propofol infusion  Airway Management Planned: Natural Airway  Additional Equipment:   Intra-op Plan:   Post-operative Plan:   Informed Consent: I have reviewed the patients History and Physical, chart, labs and discussed the procedure including the risks, benefits and alternatives for the proposed anesthesia with the patient or authorized representative who has indicated his/her understanding and acceptance.     Dental advisory given  Plan Discussed with: CRNA and Anesthesiologist  Anesthesia Plan Comments:         Anesthesia Quick Evaluation  

## 2020-04-27 ENCOUNTER — Encounter: Payer: Self-pay | Admitting: Gastroenterology

## 2020-04-27 LAB — SURGICAL PATHOLOGY

## 2020-04-28 ENCOUNTER — Encounter: Payer: Self-pay | Admitting: Gastroenterology

## 2020-06-29 ENCOUNTER — Ambulatory Visit (INDEPENDENT_AMBULATORY_CARE_PROVIDER_SITE_OTHER): Payer: Managed Care, Other (non HMO) | Admitting: Family Medicine

## 2020-06-29 ENCOUNTER — Other Ambulatory Visit: Payer: Self-pay

## 2020-06-29 ENCOUNTER — Encounter: Payer: Self-pay | Admitting: Family Medicine

## 2020-06-29 ENCOUNTER — Ambulatory Visit
Admission: RE | Admit: 2020-06-29 | Discharge: 2020-06-29 | Disposition: A | Discharge status: Home / Self Care | Payer: Managed Care, Other (non HMO) | Attending: Family Medicine | Admitting: Family Medicine

## 2020-06-29 ENCOUNTER — Ambulatory Visit
Admission: RE | Admit: 2020-06-29 | Discharge: 2020-06-29 | Disposition: A | Discharge status: Home / Self Care | Payer: Managed Care, Other (non HMO) | Source: Ambulatory Visit | Attending: Family Medicine | Admitting: Family Medicine

## 2020-06-29 VITALS — BP 147/86 | HR 82 | Temp 97.9°F | Wt 254.8 lb

## 2020-06-29 DIAGNOSIS — S134XXA Sprain of ligaments of cervical spine, initial encounter: Secondary | ICD-10-CM | POA: Diagnosis not present

## 2020-06-29 DIAGNOSIS — M545 Low back pain, unspecified: Secondary | ICD-10-CM

## 2020-06-29 DIAGNOSIS — M542 Cervicalgia: Secondary | ICD-10-CM

## 2020-06-29 MED ORDER — CYCLOBENZAPRINE HCL 10 MG PO TABS: 10.0000 mg | ORAL_TABLET | Freq: Three times a day (TID) | ORAL | 0 refills | Status: DC | PRN

## 2020-06-29 MED ORDER — NAPROXEN 500 MG PO TABS: 500.0000 mg | ORAL_TABLET | Freq: Two times a day (BID) | ORAL | 0 refills | Status: DC
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
## 2020-06-29 NOTE — Patient Instructions (Signed)
Cervical Sprain  A cervical sprain is a stretch or tear in the tissues that connect bones (ligaments) in the neck. Most neck (cervical) sprains get better in 4-6 weeks. Follow these instructions at home: If you have a neck collar:  Wear it as told by your doctor. Do not take off (do not remove) the collar unless your doctor says that this is safe.  Ask your doctor before adjusting your collar.  If you have long hair, keep it outside of the collar.  Ask your doctor if you may take off the collar for cleaning and bathing. If you may take off the collar: ? Follow instructions from your doctor about how to take off the collar safely. ? Clean the collar by wiping it with mild soap and water. Let it air-dry all the way. ? If your collar has removable pads:  Take the pads out every 1-2 days.  Hand wash the pads with soap and water.  Let the pads air-dry all the way before you put them back in the collar. Do not dry them in a clothes dryer. Do not dry them with a hair dryer. ? Check your skin under the collar for irritation or sores. If you see any, tell your doctor. Managing pain, stiffness, and swelling   Use a cervical traction device, if told by your doctor.  If told, put heat on the affected area. Do this before exercises (physical therapy) or as often as told by your doctor. Use the heat source that your doctor recommends, such as a moist heat pack or a heating pad. ? Place a towel between your skin and the heat source. ? Leave the heat on for 20-30 minutes. ? Take the heat off (remove the heat) if your skin turns bright red. This is very important if you cannot feel pain, heat, or cold. You may have a greater risk of getting burned.  Put ice on the affected area. ? Put ice in a plastic bag. ? Place a towel between your skin and the bag. ? Leave the ice on for 20 minutes, 2-3 times a day. Activity  Do not drive while wearing a neck collar. If you do not have a neck collar, ask  your doctor if it is safe to drive.  Do not drive or use heavy machinery while taking prescription pain medicine or muscle relaxants, unless your doctor approves.  Do not lift anything that is heavier than 10 lb (4.5 kg) until your doctor tells you that it is safe.  Rest as told by your doctor.  Avoid activities that make you feel worse. Ask your doctor what activities are safe for you.  Do exercises as told by your doctor or physical therapist. Preventing neck sprain  Practice good posture. Adjust your workstation to help with this, if needed.  Exercise regularly as told by your doctor or physical therapist.  Avoid activities that are risky or may cause a neck sprain (cervical sprain). General instructions  Take over-the-counter and prescription medicines only as told by your doctor.  Do not use any products that contain nicotine or tobacco. This includes cigarettes and e-cigarettes. If you need help quitting, ask your doctor.  Keep all follow-up visits as told by your doctor. This is important. Contact a doctor if:  You have pain or other symptoms that get worse.  You have symptoms that do not get better after 2 weeks.  You have pain that does not get better with medicine.  You start to   have new, unexplained symptoms.  You have sores or irritated skin from wearing your neck collar. Get help right away if:  You have very bad pain.  You have any of the following in any part of your body: ? Loss of feeling (numbness). ? Tingling. ? Weakness.  You cannot move a part of your body (you have paralysis).  Your activity level does not improve. Summary  A cervical sprain is a stretch or tear in the tissues that connect bones (ligaments) in the neck.  If you have a neck (cervical) collar, do not take off the collar unless your doctor says that this is safe.  Put ice on affected areas as told by your doctor.  Put heat on affected areas as told by your doctor.  Good  posture and regular exercise can help prevent a neck sprain from happening again. This information is not intended to replace advice given to you by your health care provider. Make sure you discuss any questions you have with your health care provider. Document Revised: 10/14/2018 Document Reviewed: 03/05/2016 Elsevier Patient Education  2020 [employer]  Cervical Strain and Sprain Rehab Ask your health care provider which exercises are safe for you. Do exercises exactly as told by your health care provider and adjust them as directed. It is normal to feel mild stretching, pulling, tightness, or discomfort as you do these exercises. Stop right away if you feel sudden pain or your pain gets worse. Do not begin these exercises until told by your health care provider. Stretching and range-of-motion exercises Cervical side bending  1. Using good posture, sit on a stable chair or stand up. 2. Without moving your shoulders, slowly tilt your left / right ear to your shoulder until you feel a stretch in the opposite side neck muscles. You should be looking straight ahead. 3. Hold for __________ seconds. 4. Repeat with the other side of your neck. Repeat __________ times. Complete this exercise __________ times a day. Cervical rotation  1. Using good posture, sit on a stable chair or stand up. 2. Slowly turn your head to the side as if you are looking over your left / right shoulder. ? Keep your eyes level with the ground. ? Stop when you feel a stretch along the side and the back of your neck. 3. Hold for __________ seconds. 4. Repeat this by turning to your other side. Repeat __________ times. Complete this exercise __________ times a day. Thoracic extension and pectoral stretch 1. Roll a towel or a small blanket so it is about 4 inches (10 cm) in diameter. 2. Lie down on your back on a firm surface. 3. Put the towel lengthwise, under your spine in the middle of your back. It should not be  under your shoulder blades. The towel should line up with your spine from your middle back to your lower back. 4. Put your hands behind your head and let your elbows fall out to your sides. 5. Hold for __________ seconds. Repeat __________ times. Complete this exercise __________ times a day. Strengthening exercises Isometric upper cervical flexion 1. Lie on your back with a thin pillow behind your head and a small rolled-up towel under your neck. 2. Gently tuck your chin toward your chest and nod your head down to look toward your feet. Do not lift your head off the pillow. 3. Hold for __________ seconds. 4. Release the tension slowly. Relax your neck muscles completely before you repeat this exercise. Repeat __________ times. Complete  this exercise __________ times a day. Isometric cervical extension  1. Stand about 6 inches (15 cm) away from a wall, with your back facing the wall. 2. Place a soft object, about 6-8 inches (15-20 cm) in diameter, between the back of your head and the wall. A soft object could be a small pillow, a ball, or a folded towel. 3. Gently tilt your head back and press into the soft object. Keep your jaw and forehead relaxed. 4. Hold for __________ seconds. 5. Release the tension slowly. Relax your neck muscles completely before you repeat this exercise. Repeat __________ times. Complete this exercise __________ times a day. Posture and body mechanics Body mechanics refers to the movements and positions of your body while you do your daily activities. Posture is part of body mechanics. Good posture and healthy body mechanics can help to relieve stress in your body's tissues and joints. Good posture means that your spine is in its natural S-curve position (your spine is neutral), your shoulders are pulled back slightly, and your head is not tipped forward. The following are general guidelines for applying improved posture and body mechanics to your everyday  activities. Sitting  1. When sitting, keep your spine neutral and keep your feet flat on the floor. Use a footrest, if necessary, and keep your thighs parallel to the floor. Avoid rounding your shoulders, and avoid tilting your head forward. 2. When working at a desk or a computer, keep your desk at a height where your hands are slightly lower than your elbows. Slide your chair under your desk so you are close enough to maintain good posture. 3. When working at a computer, place your monitor at a height where you are looking straight ahead and you do not have to tilt your head forward or downward to look at the screen. Standing   When standing, keep your spine neutral and keep your feet about hip-width apart. Keep a slight bend in your knees. Your ears, shoulders, and hips should line up.  When you do a task in which you stand in one place for a long time, place one foot up on a stable object that is 2-4 inches (5-10 cm) high, such as a footstool. This helps keep your spine neutral. Resting When lying down and resting, avoid positions that are most painful for you. Try to support your neck in a neutral position. You can use a contour pillow or a small rolled-up towel. Your pillow should support your neck but not push on it. This information is not intended to replace advice given to you by your health care provider. Make sure you discuss any questions you have with your health care provider. Document Revised: 10/14/2018 Document Reviewed: 03/25/2018 Elsevier Patient Education  2020 [employer].

## 2020-07-13 ENCOUNTER — Encounter: Payer: Self-pay | Admitting: Family Medicine

## 2020-07-13 ENCOUNTER — Other Ambulatory Visit: Payer: Self-pay

## 2020-07-13 ENCOUNTER — Ambulatory Visit (INDEPENDENT_AMBULATORY_CARE_PROVIDER_SITE_OTHER): Payer: Managed Care, Other (non HMO) | Admitting: Family Medicine

## 2020-07-13 VITALS — BP 143/85 | HR 94 | Temp 98.4°F | Wt 255.2 lb

## 2020-07-13 DIAGNOSIS — M545 Low back pain, unspecified: Secondary | ICD-10-CM | POA: Diagnosis not present

## 2020-07-13 NOTE — Patient Instructions (Signed)
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
## 2020-12-18 ENCOUNTER — Other Ambulatory Visit: Payer: Self-pay

## 2020-12-18 ENCOUNTER — Ambulatory Visit
Admission: RE | Admit: 2020-12-18 | Discharge: 2020-12-18 | Disposition: A | Discharge status: Home / Self Care | Payer: Managed Care, Other (non HMO) | Attending: Nurse Practitioner | Admitting: Nurse Practitioner

## 2020-12-18 ENCOUNTER — Ambulatory Visit: Payer: Managed Care, Other (non HMO) | Admitting: Nurse Practitioner

## 2020-12-18 ENCOUNTER — Encounter: Payer: Self-pay | Admitting: Nurse Practitioner

## 2020-12-18 ENCOUNTER — Ambulatory Visit
Admission: RE | Admit: 2020-12-18 | Discharge: 2020-12-18 | Disposition: A | Discharge status: Home / Self Care | Payer: Managed Care, Other (non HMO) | Source: Ambulatory Visit | Attending: Nurse Practitioner | Admitting: Nurse Practitioner

## 2020-12-18 VITALS — BP 144/84 | HR 78 | Temp 98.5°F | Wt 269.6 lb

## 2020-12-18 DIAGNOSIS — M25471 Effusion, right ankle: Secondary | ICD-10-CM

## 2020-12-18 DIAGNOSIS — M25571 Pain in right ankle and joints of right foot: Secondary | ICD-10-CM

## 2020-12-18 NOTE — Progress Notes (Signed)
Acute Office Visit  Subjective:    Patient ID: Jenna Fuller, female    DOB: February 01, 1971, 50 y.o.   MRN: [id number]  Chief Complaint  Patient presents with   Joint Swelling    Pt states her R ankle has been swelling for the past month, states she had a fall at home. States she wears sleeves to help with the swelling. No pain unless the ankle is touched. Also states she thinks there is a small knot like area on the lateral part of her R ankle     HPI Patient is in today for right ankle swelling and some pain when touching it  ANKLE PAIN/SWELLING  Duration: 2 months Involved ankle: Right ankle Injury: Yes, fall out of the bathtub 2 months ago Pain: when touching Description: tight when not touching, dull pain when touching Severity: moderate Alleviating factors: compression sleeve Aggravating factors: Unkown Swelling: Yes Redness: No Bruising: No Treatment at home: compression sleeve, ice, heat   Past Medical History:  Diagnosis Date   Mammographic microcalcification     Past Surgical History:  Procedure Laterality Date   BREAST BIOPSY Left 2013   neg   COLONOSCOPY WITH PROPOFOL N/A 04/26/2020   Procedure: COLONOSCOPY WITH PROPOFOL;  Surgeon: Toney Reil, MD;  Location: [facility];  Service: Gastroenterology;  Laterality: N/A;    Family History  Problem Relation Age of Onset   Breast cancer Mother 74       BRACA NEG   Cancer Mother        breast   Ulcerative colitis Sister    Irritable bowel syndrome Sister    Diabetes Paternal Grandmother    Hypertension Paternal Grandmother    Breast cancer Maternal Aunt 51   Breast cancer Paternal Aunt 25   Irritable bowel syndrome Paternal Uncle    Irritable bowel syndrome Paternal Aunt     Social History   Socioeconomic History   Marital status: Divorced    Spouse name: Not on file   Number of children: Not on file   Years of education: Not on file   Highest education level: Not on file   Occupational History   Not on file  Tobacco Use   Smoking status: Never   Smokeless tobacco: Never  Vaping Use   Vaping Use: Never used  Substance and Sexual Activity   Alcohol use: No   Drug use: No   Sexual activity: Not Currently  Other Topics Concern   Not on file  Social History Narrative   Not on file   Social Determinants of Health   Financial Resource Strain: Not on file  Food Insecurity: Not on file  Transportation Needs: Not on file  Physical Activity: Not on file  Stress: Not on file  Social Connections: Not on file  Intimate Partner Violence: Not on file    Outpatient Medications Prior to Visit  Medication Sig Dispense Refill   Coenzyme Q10 (CO Q 10 PO) Take by mouth.     Multiple Vitamins-Minerals (MULTIVITAL PO) Take 1 tablet by mouth daily.     Omega-3 Fatty Acids (FISH OIL) 1000 MG CAPS Take 1 capsule by mouth daily.     Red Yeast Rice Extract (RED YEAST RICE PO) Take by mouth.     cyclobenzaprine (FLEXERIL) 10 MG tablet Take 1 tablet (10 mg total) by mouth 3 (three) times daily as needed for muscle spasms. 30 tablet 0   GARLIC PO Take 1 tablet by mouth daily. (Patient not taking:  Reported on 07/13/2020)     naproxen (NAPROSYN) 500 MG tablet Take 1 tablet (500 mg total) by mouth 2 (two) times daily with a meal. 30 tablet 0   Turmeric (QC TUMERIC COMPLEX PO) Take 1 tablet by mouth daily.     No facility-administered medications prior to visit.    No Known Allergies  Review of Systems  Constitutional: Negative.   HENT: Negative.    Respiratory: Negative.    Cardiovascular: Negative.   Gastrointestinal: Negative.   Genitourinary: Negative.   Musculoskeletal:        Right ankle pain and swelling  Skin: Negative.   Neurological: Negative.       Objective:    Physical Exam Vitals and nursing note reviewed.  Constitutional:      General: She is not in acute distress.    Appearance: Normal appearance.  HENT:     Head: Normocephalic.  Eyes:      Conjunctiva/sclera: Conjunctivae normal.  Cardiovascular:     Rate and Rhythm: Normal rate and regular rhythm.  Pulmonary:     Effort: Pulmonary effort is normal.     Breath sounds: Normal breath sounds.  Musculoskeletal:        General: Swelling (right ankle) present. No tenderness. Normal range of motion.     Cervical back: Normal range of motion.     Right lower leg: Edema present.  Skin:    General: Skin is warm.  Neurological:     General: No focal deficit present.     Mental Status: She is alert and oriented to person, place, and time.  Psychiatric:        Mood and Affect: Mood normal.        Behavior: Behavior normal.        Thought Content: Thought content normal.        Judgment: Judgment normal.    BP (!) 144/84   Pulse 78   Temp 98.5 F (36.9 C) (Oral)   Wt 269 lb 9.6 oz (122.3 kg)   LMP 04/15/2016 (Approximate)   SpO2 97%   BMI 44.86 kg/m  Wt Readings from Last 3 Encounters:  12/18/20 269 lb 9.6 oz (122.3 kg)  07/13/20 255 lb 3.2 oz (115.8 kg)  06/29/20 254 lb 12.8 oz (115.6 kg)    Health Maintenance Due  Topic Date Due   COVID-19 Vaccine (3 - Booster for Pfizer series) 05/24/2020   TETANUS/TDAP  08/17/2020    There are no preventive care reminders to display for this patient.   Lab Results  Component Value Date   TSH 2.030 03/22/2020   Lab Results  Component Value Date   WBC 4.2 03/22/2020   HGB 13.7 03/22/2020   HCT 42.9 03/22/2020   MCV 81 03/22/2020   PLT 246 03/22/2020   Lab Results  Component Value Date   NA 139 03/22/2020   K 4.2 03/22/2020   CO2 22 03/22/2020   GLUCOSE 80 03/22/2020   BUN 11 03/22/2020   CREATININE 0.88 03/22/2020   BILITOT 0.4 03/22/2020   ALKPHOS 82 03/22/2020   AST 20 03/22/2020   ALT 14 03/22/2020   PROT 7.4 03/22/2020   ALBUMIN 4.7 03/22/2020   CALCIUM 9.9 03/22/2020   Lab Results  Component Value Date   CHOL 288 (H) 03/22/2020   Lab Results  Component Value Date   HDL 69 03/22/2020   Lab  Results  Component Value Date   LDLCALC 207 (H) 03/22/2020   Lab Results  Component Value  Date   TRIG 78 03/22/2020   Lab Results  Component Value Date   CHOLHDL 2.6 05/07/2018   Lab Results  Component Value Date   HGBA1C 5.6 02/02/2018       Assessment & Plan:   Problem List Items Addressed This Visit   None Visit Diagnoses     Right ankle swelling    -  Primary   Acute x2 months s/p fall. Will check x-ray right ankle and tib/fib. Continue compression sleeve. May use ice/heat. F/U based on x-ray results.    Relevant Orders   DG Ankle Complete Right   DG Tibia/Fibula Right   Acute right ankle pain       Improving, may take ibuprofen/tylenol to help with the pain        No orders of the defined types were placed in this encounter.    Gerre Scull, NP

## 2021-03-26 ENCOUNTER — Encounter: Payer: Managed Care, Other (non HMO) | Admitting: Family Medicine

## 2021-04-13 ENCOUNTER — Other Ambulatory Visit: Payer: Self-pay

## 2021-04-13 ENCOUNTER — Ambulatory Visit: Payer: Self-pay | Attending: Internal Medicine

## 2021-04-13 DIAGNOSIS — Z23 Encounter for immunization: Secondary | ICD-10-CM

## 2021-04-13 MED ORDER — PFIZER COVID-19 VAC BIVALENT 30 MCG/0.3ML IM SUSP
INTRAMUSCULAR | 0 refills | Status: DC
Start: 2021-04-13 — End: 2021-05-16
  Filled 2021-04-13: qty 0.3, 1d supply, fill #0

## 2021-04-13 NOTE — Progress Notes (Signed)
   Covid-19 Vaccination Clinic  Name:  Jenna Fuller    MRN: [id number] DOB: April 19, 1971  04/13/2021  [name] was observed post Covid-19 immunization for 15 minutes without incident. She was provided with Vaccine Information Sheet and instruction to access the V-Safe system.   [name] was instructed to call 911 with any severe reactions post vaccine: Difficulty breathing  Swelling of face and throat  A fast heartbeat  A bad rash all over body  Dizziness and weakness   Drusilla Kanner, PharmD, MBA Clinical Acute Care Pharmacist

## 2021-05-16 ENCOUNTER — Ambulatory Visit (INDEPENDENT_AMBULATORY_CARE_PROVIDER_SITE_OTHER): Payer: Managed Care, Other (non HMO) | Admitting: Family Medicine

## 2021-05-16 ENCOUNTER — Other Ambulatory Visit: Payer: Self-pay

## 2021-05-16 ENCOUNTER — Encounter: Payer: Self-pay | Admitting: Family Medicine

## 2021-05-16 VITALS — BP 128/76 | HR 71 | Temp 98.3°F | Ht 65.5 in | Wt 258.2 lb

## 2021-05-16 DIAGNOSIS — Z23 Encounter for immunization: Secondary | ICD-10-CM

## 2021-05-16 DIAGNOSIS — E785 Hyperlipidemia, unspecified: Secondary | ICD-10-CM

## 2021-05-16 DIAGNOSIS — N951 Menopausal and female climacteric states: Secondary | ICD-10-CM

## 2021-05-16 DIAGNOSIS — Z0000 Encounter for general adult medical examination without abnormal findings: Secondary | ICD-10-CM | POA: Diagnosis not present

## 2021-05-16 DIAGNOSIS — Z1231 Encounter for screening mammogram for malignant neoplasm of breast: Secondary | ICD-10-CM

## 2021-05-16 LAB — URINALYSIS, ROUTINE W REFLEX MICROSCOPIC
Bilirubin, UA: NEGATIVE
Glucose, UA: NEGATIVE
Ketones, UA: NEGATIVE
Leukocytes,UA: NEGATIVE
Nitrite, UA: NEGATIVE
Protein,UA: NEGATIVE
RBC, UA: NEGATIVE
Specific Gravity, UA: 1.015 (ref 1.005–1.030)
Urobilinogen, Ur: 0.2 mg/dL (ref 0.2–1.0)
pH, UA: 6 (ref 5.0–7.5)

## 2021-05-16 NOTE — Progress Notes (Signed)
BP 128/76   Pulse 71   Temp 98.3 F (36.8 C) (Oral)   Ht 5' 5.5" (1.664 m)   Wt 258 lb 3.2 oz (117.1 kg)   LMP 04/15/2016 (Approximate)   SpO2 99%   BMI 42.31 kg/m    Subjective:    Patient ID: Jenna Fuller, female    DOB: [birth year], 50 y.o.   MRN: [id number]  HPI: Jenna Fuller is a 50 y.o. female presenting on 05/16/2021 for comprehensive medical examination. Current medical complaints include:  HYPERLIPIDEMIA Hyperlipidemia status: excellent compliance Satisfied with current treatment?  yes Side effects:  no Medication compliance: excellent compliance Past cholesterol meds: none Supplements: red yeast rice Aspirin:  no The 10-year ASCVD risk score (Arnett DK, et al., 2019) is: 1.7%   Values used to calculate the score:     Age: 61 years     Sex: Female     Is Non-Hispanic African American: Yes     Diabetic: No     Tobacco smoker: No     Systolic Blood Pressure: 0000000 mmHg     Is BP treated: No     HDL Cholesterol: 69 mg/dL     Total Cholesterol: 288 mg/dL Chest pain:  no  Menopausal Symptoms: yes  Depression Screen done today and results listed below:  Depression screen [facility] 2/9 05/16/2021 03/22/2020 02/17/2018 07/30/2016 07/26/2015  Decreased Interest 0 0 0 0 0  Down, Depressed, Hopeless 0 0 0 0 0  PHQ - 2 Score 0 0 0 0 0  Altered sleeping 0 0 - - -  Tired, decreased energy 0 0 - - -  Change in appetite 0 0 - - -  Feeling bad or failure about yourself  0 0 - - -  Trouble concentrating 0 0 - - -  Moving slowly or fidgety/restless 0 0 - - -  Suicidal thoughts 0 0 - - -  PHQ-9 Score 0 0 - - -  Difficult doing work/chores Not difficult at all Not difficult at all - - -    Past Medical History:  Past Medical History:  Diagnosis Date   Mammographic microcalcification     Surgical History:  Past Surgical History:  Procedure Laterality Date   BREAST BIOPSY Left 2013   neg   COLONOSCOPY WITH PROPOFOL N/A 04/26/2020   Procedure:  COLONOSCOPY WITH PROPOFOL;  Surgeon: Lin Landsman, MD;  Location: [facility] ENDOSCOPY;  Service: Gastroenterology;  Laterality: N/A;    Medications:  Current Outpatient Medications on File Prior to Visit  Medication Sig   B Complex-Biotin-FA (BALANCE B-50 PO) Take 4 capsules by mouth daily.   Coenzyme Q10 (CO Q 10 PO) Take by mouth.   Multiple Vitamins-Minerals (MULTIVITAL PO) Take 1 tablet by mouth daily.   Omega-3 Fatty Acids (FISH OIL) 1000 MG CAPS Take 1 capsule by mouth daily.   Red Yeast Rice Extract (RED YEAST RICE PO) Take by mouth.   No current facility-administered medications on file prior to visit.    Allergies:  No Known Allergies  Social History:  Social History   Socioeconomic History   Marital status: Divorced    Spouse name: Not on file   Number of children: Not on file   Years of education: Not on file   Highest education level: Not on file  Occupational History   Not on file  Tobacco Use   Smoking status: Never   Smokeless tobacco: Never  Vaping Use   Vaping Use: Never used  Substance  and Sexual Activity   Alcohol use: No   Drug use: No   Sexual activity: Not Currently  Other Topics Concern   Not on file  Social History Narrative   Not on file   Social Determinants of Health   Financial Resource Strain: Not on file  Food Insecurity: Not on file  Transportation Needs: Not on file  Physical Activity: Not on file  Stress: Not on file  Social Connections: Not on file  Intimate Partner Violence: Not on file   Social History   Tobacco Use  Smoking Status Never  Smokeless Tobacco Never   Social History   Substance and Sexual Activity  Alcohol Use No    Family History:  Family History  Problem Relation Age of Onset   Breast cancer Mother 2       BRACA NEG   Cancer Mother        breast   Ulcerative colitis Sister    Irritable bowel syndrome Sister    Diabetes Paternal Grandmother    Hypertension Paternal Grandmother    Breast  cancer Maternal Aunt 60   Breast cancer Paternal Aunt 60   Irritable bowel syndrome Paternal Uncle    Irritable bowel syndrome Paternal Aunt     Past medical history, surgical history, medications, allergies, family history and social history reviewed with patient today and changes made to appropriate areas of the chart.   Review of Systems  Constitutional: Negative.   HENT: Negative.    Eyes: Negative.   Respiratory: Negative.    Cardiovascular: Negative.   Gastrointestinal: Negative.   Genitourinary: Negative.   Musculoskeletal: Negative.   Skin: Negative.   Neurological: Negative.   Endo/Heme/Allergies: Negative.   Psychiatric/Behavioral: Negative.    All other ROS negative except what is listed above and in the HPI.      Objective:    BP 128/76   Pulse 71   Temp 98.3 F (36.8 C) (Oral)   Ht 5' 5.5" (1.664 m)   Wt 258 lb 3.2 oz (117.1 kg)   LMP 04/15/2016 (Approximate)   SpO2 99%   BMI 42.31 kg/m   Wt Readings from Last 3 Encounters:  05/16/21 258 lb 3.2 oz (117.1 kg)  12/18/20 269 lb 9.6 oz (122.3 kg)  07/13/20 255 lb 3.2 oz (115.8 kg)    Physical Exam Vitals and nursing note reviewed.  Constitutional:      General: She is not in acute distress.    Appearance: Normal appearance. She is not ill-appearing, toxic-appearing or diaphoretic.  HENT:     Head: Normocephalic and atraumatic.     Right Ear: Tympanic membrane, ear canal and external ear normal. There is no impacted cerumen.     Left Ear: Tympanic membrane, ear canal and external ear normal. There is no impacted cerumen.     Nose: Nose normal. No congestion or rhinorrhea.     Mouth/Throat:     Mouth: Mucous membranes are moist.     Pharynx: Oropharynx is clear. No oropharyngeal exudate or posterior oropharyngeal erythema.  Eyes:     General: No scleral icterus.       Right eye: No discharge.        Left eye: No discharge.     Extraocular Movements: Extraocular movements intact.      Conjunctiva/sclera: Conjunctivae normal.     Pupils: Pupils are equal, round, and reactive to light.  Neck:     Vascular: No carotid bruit.  Cardiovascular:     Rate and  Rhythm: Normal rate and regular rhythm.     Pulses: Normal pulses.     Heart sounds: No murmur heard.   No friction rub. No gallop.  Pulmonary:     Effort: Pulmonary effort is normal. No respiratory distress.     Breath sounds: Normal breath sounds. No stridor. No wheezing, rhonchi or rales.  Chest:     Chest wall: No tenderness.  Abdominal:     General: Abdomen is flat. Bowel sounds are normal. There is no distension.     Palpations: Abdomen is soft. There is no mass.     Tenderness: There is no abdominal tenderness. There is no right CVA tenderness, left CVA tenderness, guarding or rebound.     Hernia: No hernia is present.  Genitourinary:    Comments: Breast and pelvic exams deferred with shared decision making Musculoskeletal:        General: No swelling, tenderness, deformity or signs of injury.     Cervical back: Normal range of motion and neck supple. No rigidity. No muscular tenderness.     Right lower leg: No edema.     Left lower leg: No edema.  Lymphadenopathy:     Cervical: No cervical adenopathy.  Skin:    General: Skin is warm and dry.     Capillary Refill: Capillary refill takes less than 2 seconds.     Coloration: Skin is not jaundiced or pale.     Findings: No bruising, erythema, lesion or rash.  Neurological:     General: No focal deficit present.     Mental Status: She is alert and oriented to person, place, and time. Mental status is at baseline.     Cranial Nerves: No cranial nerve deficit.     Sensory: No sensory deficit.     Motor: No weakness.     Coordination: Coordination normal.     Gait: Gait normal.     Deep Tendon Reflexes: Reflexes normal.  Psychiatric:        Mood and Affect: Mood normal.        Behavior: Behavior normal.        Thought Content: Thought content normal.         Judgment: Judgment normal.    Results for orders placed or performed during the hospital encounter of 04/26/20  SARS Coronavirus 2 by RT PCR (hospital order, performed in [facility] hospital lab) Nasopharyngeal Nasopharyngeal Swab   Specimen: Nasopharyngeal Swab  Result Value Ref Range   SARS Coronavirus 2 NEGATIVE NEGATIVE  Surgical pathology  Result Value Ref Range   SURGICAL PATHOLOGY      SURGICAL PATHOLOGY CASE: [id number] PATIENT: Ahmya Dembeck Surgical Pathology Report     Specimen Submitted: A. Colon polyp x2, transverse; cold snare  Clinical History: Screening colonoscopy. Colon polyps.      DIAGNOSIS: A. COLON POLYP X2, TRANSVERSE; COLD SNARE: - FEATURES SUGGESTIVE OF SESSILE SERRATED POLYP, ONE FRAGMENT. - INTRAMUCOSAL LYMPHOID AGGREGATES, ONE FRAGMENT. - NEGATIVE FOR DYSPLASIA AND MALIGNANCY.   GROSS DESCRIPTION: A. Labeled: Cold snare polyp transverse colon x2 Received: In formalin Tissue fragment(s): Multiple Size: 1 x 0.5 x 0.1 cm Description: Aggregate of tan tissue fragments Entirely submitted in 1 cassette.    Final Diagnosis performed by Betsy Pries, MD.   Electronically signed 04/27/2020 11:11:58AM The electronic signature indicates that the named Attending Pathologist has evaluated the specimen Technical component performed at [facility], [address] [place], [place] [address] Lab: [phone] Dir: Rich Reining, MD, MMM  Professional component performed  at [facility], [facility], [address] [place], [place], [place] [address] Lab: [phone] Dir: Georgiann Cocker. Oneita Kras, MD       Assessment & Plan:   Problem List Items Addressed This Visit       Cardiovascular and Mediastinum   Hot flashes, menopausal    Doing well with a supplement. Would like to check hormone levels. Drawn today. Await results.      Relevant Orders   FSH   LH   Estradiol   Testosterone, free, total([facility])     Other    Hyperlipidemia    Rechecking labs today. Await results. Treat as needed.       Other Visit Diagnoses     Routine general medical examination at a health care facility    -  Primary   Vaccines up to date. Screening labs checked today. Pap and colonoscopy up to date. Mammogram ordered. Call with any concerns. Continue to monitor.    Relevant Orders   CBC with Differential/Platelet   Comprehensive metabolic panel   Lipid Panel w/o Chol/HDL Ratio   Urinalysis, Routine w reflex microscopic   TSH   VITAMIN D 25 Hydroxy (Vit-D Deficiency, Fractures)   Encounter for screening mammogram for malignant neoplasm of breast       Mammogram ordered today.   Relevant Orders   MM DIAG BREAST TOMO BILATERAL        Follow up plan: Return in about 1 year (around 05/16/2022), or physical.   LABORATORY TESTING:  - Pap smear: up to date  IMMUNIZATIONS:   - Tdap: Tetanus vaccination status reviewed: Tdap vaccination indicated and given today. - Influenza: Refused - Pneumovax: Not applicable - Prevnar: Not applicable - COVID: Up to date - Shingrix vaccine: Not applicable  SCREENING: -Mammogram: Ordered today  - Colonoscopy: Up to date   PATIENT COUNSELING:   Advised to take 1 mg of folate supplement per day if capable of pregnancy.   Sexuality: Discussed sexually transmitted diseases, partner selection, use of condoms, avoidance of unintended pregnancy  and contraceptive alternatives.   Advised to avoid cigarette smoking.  I discussed with the patient that most people either abstain from alcohol or drink within safe limits (<=14/week and <=4 drinks/occasion for males, <=7/weeks and <= 3 drinks/occasion for females) and that the risk for alcohol disorders and other health effects rises proportionally with the number of drinks per week and how often a drinker exceeds daily limits.  Discussed cessation/primary prevention of drug use and availability of treatment for abuse.   Diet: Encouraged  to adjust caloric intake to maintain  or achieve ideal body weight, to reduce intake of dietary saturated fat and total fat, to limit sodium intake by avoiding high sodium foods and not adding table salt, and to maintain adequate dietary potassium and calcium preferably from fresh fruits, vegetables, and low-fat dairy products.    stressed the importance of regular exercise  Injury prevention: Discussed safety belts, safety helmets, smoke detector, smoking near bedding or upholstery.   Dental health: Discussed importance of regular tooth brushing, flossing, and dental visits.    NEXT PREVENTATIVE PHYSICAL DUE IN 1 YEAR. Return in about 1 year (around 05/16/2022), or physical.

## 2021-05-16 NOTE — Patient Instructions (Signed)
Please call to schedule your mammogram: °[facility]  °Address: [address], [place], [place] [address]  °Phone: [phone] ° °

## 2021-05-16 NOTE — Assessment & Plan Note (Signed)
Doing well with a supplement. Would like to check hormone levels. Drawn today. Await results.

## 2021-05-16 NOTE — Assessment & Plan Note (Signed)
Rechecking labs today. Await results. Treat as needed.  °

## 2021-05-18 LAB — TESTOSTERONE, FREE, TOTAL, SHBG
Sex Hormone Binding: 51.1 nmol/L (ref 24.6–122.0)
Testosterone, Free: 0.3 pg/mL (ref 0.0–4.2)
Testosterone: 3 ng/dL — ABNORMAL LOW (ref 4–50)

## 2021-05-18 LAB — COMPREHENSIVE METABOLIC PANEL
ALT: 15 IU/L (ref 0–32)
AST: 21 IU/L (ref 0–40)
Albumin/Globulin Ratio: 1.9 (ref 1.2–2.2)
Albumin: 4.8 g/dL (ref 3.8–4.8)
Alkaline Phosphatase: 87 IU/L (ref 44–121)
BUN/Creatinine Ratio: 15 (ref 9–23)
BUN: 13 mg/dL (ref 6–24)
Bilirubin Total: 0.3 mg/dL (ref 0.0–1.2)
CO2: 24 mmol/L (ref 20–29)
Calcium: 9.8 mg/dL (ref 8.7–10.2)
Chloride: 102 mmol/L (ref 96–106)
Creatinine, Ser: 0.87 mg/dL (ref 0.57–1.00)
Globulin, Total: 2.5 g/dL (ref 1.5–4.5)
Glucose: 89 mg/dL (ref 70–99)
Potassium: 4.5 mmol/L (ref 3.5–5.2)
Sodium: 140 mmol/L (ref 134–144)
Total Protein: 7.3 g/dL (ref 6.0–8.5)
eGFR: 82 mL/min/{1.73_m2} (ref >59)

## 2021-05-18 LAB — CBC WITH DIFFERENTIAL/PLATELET
Basophils Absolute: 0 10*3/uL (ref 0.0–0.2)
Basos: 1 %
EOS (ABSOLUTE): 0.2 10*3/uL (ref 0.0–0.4)
Eos: 4 %
Hematocrit: 42.2 % (ref 34.0–46.6)
Hemoglobin: 13.5 g/dL (ref 11.1–15.9)
Immature Grans (Abs): 0 10*3/uL (ref 0.0–0.1)
Immature Granulocytes: 0 %
Lymphocytes Absolute: 2.2 10*3/uL (ref 0.7–3.1)
Lymphs: 35 %
MCH: 26.4 pg — ABNORMAL LOW (ref 26.6–33.0)
MCHC: 32 g/dL (ref 31.5–35.7)
MCV: 83 fL (ref 79–97)
Monocytes Absolute: 0.6 10*3/uL (ref 0.1–0.9)
Monocytes: 9 %
Neutrophils Absolute: 3.2 10*3/uL (ref 1.4–7.0)
Neutrophils: 51 %
Platelets: 264 10*3/uL (ref 150–450)
RBC: 5.11 x10E6/uL (ref 3.77–5.28)
RDW: 13 % (ref 11.7–15.4)
WBC: 6.2 10*3/uL (ref 3.4–10.8)

## 2021-05-18 LAB — LIPID PANEL W/O CHOL/HDL RATIO
Cholesterol, Total: 301 mg/dL — ABNORMAL HIGH (ref 100–199)
HDL: 76 mg/dL (ref >39)
LDL Chol Calc (NIH): 207 mg/dL — ABNORMAL HIGH (ref 0–99)
Triglycerides: 104 mg/dL (ref 0–149)
VLDL Cholesterol Cal: 18 mg/dL (ref 5–40)

## 2021-05-18 LAB — ESTRADIOL: Estradiol: <5 pg/mL

## 2021-05-18 LAB — TSH: TSH: 2.75 u[IU]/mL (ref 0.450–4.500)

## 2021-05-18 LAB — LUTEINIZING HORMONE: LH: 32.7 m[IU]/mL

## 2021-05-18 LAB — VITAMIN D 25 HYDROXY (VIT D DEFICIENCY, FRACTURES): Vit D, 25-Hydroxy: 25.5 ng/mL — ABNORMAL LOW (ref 30.0–100.0)

## 2021-05-18 LAB — FOLLICLE STIMULATING HORMONE: FSH: 46.7 m[IU]/mL

## 2021-08-20 ENCOUNTER — Other Ambulatory Visit: Payer: Self-pay | Admitting: Family Medicine

## 2021-08-20 DIAGNOSIS — Z1231 Encounter for screening mammogram for malignant neoplasm of breast: Secondary | ICD-10-CM

## 2021-08-30 ENCOUNTER — Encounter: Payer: Self-pay | Admitting: Family Medicine

## 2021-09-03 ENCOUNTER — Other Ambulatory Visit: Payer: Self-pay | Admitting: Family Medicine

## 2021-09-03 MED ORDER — ATORVASTATIN CALCIUM 40 MG PO TABS: 40.0000 mg | ORAL_TABLET | Freq: Every day | ORAL | 0 refills | Status: DC

## 2021-09-18 ENCOUNTER — Ambulatory Visit
Admission: RE | Admit: 2021-09-18 | Discharge: 2021-09-18 | Disposition: A | Discharge status: Home / Self Care | Payer: Managed Care, Other (non HMO) | Source: Ambulatory Visit | Attending: Family Medicine | Admitting: Family Medicine

## 2021-09-18 ENCOUNTER — Other Ambulatory Visit: Payer: Self-pay

## 2021-09-18 DIAGNOSIS — Z1231 Encounter for screening mammogram for malignant neoplasm of breast: Secondary | ICD-10-CM | POA: Diagnosis present

## 2021-09-18 NOTE — Progress Notes (Signed)
Contacted via MyChart   Normal mammogram, may repeat in one year:)

## 2021-10-25 ENCOUNTER — Other Ambulatory Visit: Payer: Self-pay | Admitting: Family Medicine

## 2021-10-26 NOTE — Telephone Encounter (Signed)
Requested Prescriptions  ?Pending Prescriptions Disp Refills  ?? atorvastatin (LIPITOR) 40 MG tablet [Pharmacy Med Name: Atorvastatin Calcium 40 MG Oral Tablet] 60 tablet 5  ?  Sig: TAKE 1 TABLET BY MOUTH ONCE  DAILY  ?  ? Cardiovascular:  Antilipid - Statins Failed - 10/25/2021 10:44 PM  ?  ?  Failed - Lipid Panel in normal range within the last 12 months  ?  Cholesterol, Total  ?Date Value Ref Range Status  ?05/16/2021 301 (H) 100 - 199 mg/dL Final  ? ?LDL Chol Calc (NIH)  ?Date Value Ref Range Status  ?05/16/2021 207 (H) 0 - 99 mg/dL Final  ? ?HDL  ?Date Value Ref Range Status  ?05/16/2021 76 >39 mg/dL Final  ? ?Triglycerides  ?Date Value Ref Range Status  ?05/16/2021 104 0 - 149 mg/dL Final  ? ?  ?  ?  Passed - Patient is not pregnant  ?  ?  Passed - Valid encounter within last 12 months  ?  Recent Outpatient Visits   ?      ? 5 months ago Routine general medical examination at a health care facility  ? [place] P, DO  ? 10 months ago Right ankle swelling  ? [place], NP  ? 1 year ago Acute right-sided low back pain without sciatica  ? [place] P, DO  ? 1 year ago Neck pain  ? [place], Megan P, DO  ? 1 year ago Hot flashes, menopausal  ? [place], [place] P, DO  ?  ?  ? ?  ?  ?  ? ?

## 2021-11-04 ENCOUNTER — Encounter: Payer: Self-pay | Admitting: Family Medicine

## 2021-11-04 DIAGNOSIS — R140 Abdominal distension (gaseous): Secondary | ICD-10-CM

## 2022-01-03 LAB — LAB REPORT - SCANNED
A1c: 6.1
EGFR: 78

## 2022-04-11 ENCOUNTER — Other Ambulatory Visit: Payer: Self-pay | Admitting: Family Medicine

## 2022-04-11 NOTE — Telephone Encounter (Signed)
Requested Prescriptions  Pending Prescriptions Disp Refills  . atorvastatin (LIPITOR) 40 MG tablet [Pharmacy Med Name: Atorvastatin Calcium 40 MG Oral Tablet] 90 tablet 0    Sig: TAKE 1 TABLET BY MOUTH ONCE  DAILY     Cardiovascular:  Antilipid - Statins Failed - 04/11/2022  5:37 AM      Failed - Lipid Panel in normal range within the last 12 months    Cholesterol, Total  Date Value Ref Range Status  05/16/2021 301 (H) 100 - 199 mg/dL Final   LDL Chol Calc (NIH)  Date Value Ref Range Status  05/16/2021 207 (H) 0 - 99 mg/dL Final   HDL  Date Value Ref Range Status  05/16/2021 76 >39 mg/dL Final   Triglycerides  Date Value Ref Range Status  05/16/2021 104 0 - 149 mg/dL Final         Passed - Patient is not pregnant      Passed - Valid encounter within last 12 months    Recent Outpatient Visits          11 months ago Routine general medical examination at a health care facility   [facility], [place], DO   1 year ago Right ankle swelling   [place], Lauren A, NP   1 year ago Acute right-sided low back pain without sciatica   [place], [place], DO   1 year ago Neck pain   [place], DO   1 year ago Hot flashes, menopausal   [place], [place], DO

## 2022-06-17 ENCOUNTER — Other Ambulatory Visit: Payer: Self-pay | Admitting: Family Medicine

## 2022-06-18 NOTE — Telephone Encounter (Signed)
Requested medications are due for refill today.  yes  Requested medications are on the active medications list.  yes  Last refill. 04/11/2022 #90 0 rf  Future visit scheduled.   no  Notes to clinic.  Labs are expired. Pt needs an OV.    Requested Prescriptions  Pending Prescriptions Disp Refills   atorvastatin (LIPITOR) 40 MG tablet [Pharmacy Med Name: Atorvastatin Calcium 40 MG Oral Tablet] 90 tablet 3    Sig: TAKE 1 TABLET BY MOUTH ONCE  DAILY     Cardiovascular:  Antilipid - Statins Failed - 06/17/2022 11:37 PM      Failed - Valid encounter within last 12 months    Recent Outpatient Visits           1 year ago Routine general medical examination at a health care facility   [facility], [place] P, DO   1 year ago Right ankle swelling   [facility] [place], Lauren A, NP   1 year ago Acute right-sided low back pain without sciatica   [facility] [place], [place], DO   1 year ago Neck pain   [facility] [place], [place], DO   2 years ago Hot flashes, menopausal   [facility] [place], Megan P, DO              Failed - Lipid Panel in normal range within the last 12 months    Cholesterol, Total  Date Value Ref Range Status  05/16/2021 301 (H) 100 - 199 mg/dL Final   LDL Chol Calc (NIH)  Date Value Ref Range Status  05/16/2021 207 (H) 0 - 99 mg/dL Final   HDL  Date Value Ref Range Status  05/16/2021 76 >39 mg/dL Final   Triglycerides  Date Value Ref Range Status  05/16/2021 104 0 - 149 mg/dL Final         Passed - Patient is not pregnant

## 2022-08-09 ENCOUNTER — Encounter: Payer: Managed Care, Other (non HMO) | Admitting: Family Medicine

## 2022-09-10 ENCOUNTER — Ambulatory Visit (INDEPENDENT_AMBULATORY_CARE_PROVIDER_SITE_OTHER): Payer: Managed Care, Other (non HMO) | Admitting: Family Medicine

## 2022-09-10 ENCOUNTER — Encounter: Payer: Self-pay | Admitting: Family Medicine

## 2022-09-10 VITALS — BP 132/85 | HR 85 | Temp 98.4°F | Ht 66.0 in | Wt 294.5 lb

## 2022-09-10 DIAGNOSIS — E785 Hyperlipidemia, unspecified: Secondary | ICD-10-CM | POA: Diagnosis not present

## 2022-09-10 DIAGNOSIS — Z1231 Encounter for screening mammogram for malignant neoplasm of breast: Secondary | ICD-10-CM | POA: Diagnosis not present

## 2022-09-10 DIAGNOSIS — Z0000 Encounter for general adult medical examination without abnormal findings: Secondary | ICD-10-CM

## 2022-09-10 DIAGNOSIS — E60 Dietary zinc deficiency: Secondary | ICD-10-CM

## 2022-09-10 LAB — URINALYSIS, ROUTINE W REFLEX MICROSCOPIC
Bilirubin, UA: NEGATIVE
Glucose, UA: NEGATIVE
Ketones, UA: NEGATIVE
Leukocytes,UA: NEGATIVE
Nitrite, UA: NEGATIVE
Protein,UA: NEGATIVE
RBC, UA: NEGATIVE
Specific Gravity, UA: 1.01 (ref 1.005–1.030)
Urobilinogen, Ur: 0.2 mg/dL (ref 0.2–1.0)
pH, UA: 5.5 (ref 5.0–7.5)

## 2022-09-10 LAB — BAYER DCA HB A1C WAIVED: HB A1C (BAYER DCA - WAIVED): 6.1 % — ABNORMAL HIGH (ref 4.8–5.6)

## 2022-09-10 MED ORDER — ATORVASTATIN CALCIUM 40 MG PO TABS: 40.0000 mg | ORAL_TABLET | Freq: Every day | ORAL | 3 refills | Status: DC

## 2022-09-10 NOTE — Assessment & Plan Note (Signed)
Working on dietary changes. Will check A1c. Await results. Treat as needed.

## 2022-09-10 NOTE — Progress Notes (Signed)
BP 132/85   Pulse 85   Temp 98.4 F (36.9 C) (Oral)   Ht '5\' 6"'$  (1.676 m)   Wt 294 lb 8 oz (133.6 kg)   LMP 04/09/2016   SpO2 97%   BMI 47.53 kg/m    Subjective:    Patient ID: Jenna Fuller, female    DOB: [birth year], 52 y.o.   MRN: [id number]  HPI: Jenna Fuller is a 52 y.o. female presenting on 09/10/2022 for comprehensive medical examination. Current medical complaints include:  HYPERLIPIDEMIA Hyperlipidemia status: excellent compliance Satisfied with current treatment?  yes Side effects:  no Medication compliance: excellent compliance Past cholesterol meds: atorvastatin Supplements: fish oil Aspirin:  no The 10-year ASCVD risk score (Arnett DK, et al., 2019) is: 2%   Values used to calculate the score:     Age: 32 years     Sex: Female     Is Non-Hispanic African American: Yes     Diabetic: No     Tobacco smoker: No     Systolic Blood Pressure: [id number] mmHg     Is BP treated: No     HDL Cholesterol: 76 mg/dL     Total Cholesterol: 301 mg/dL Chest pain:  no Coronary artery disease:  no  Menopausal Symptoms: yes  Depression Screen done today and results listed below:     09/10/2022    8:48 AM 05/16/2021    8:13 AM 03/22/2020    8:41 AM 02/17/2018    3:23 PM 07/30/2016    2:03 PM  Depression screen PHQ 2/9  Decreased Interest 0 0 0 0 0  Down, Depressed, Hopeless 0 0 0 0 0  PHQ - 2 Score 0 0 0 0 0  Altered sleeping 1 0 0    Tired, decreased energy 0 0 0    Change in appetite 0 0 0    Feeling bad or failure about yourself  0 0 0    Trouble concentrating 0 0 0    Moving slowly or fidgety/restless 0 0 0    Suicidal thoughts 0 0 0    PHQ-9 Score 1 0 0    Difficult doing work/chores Not difficult at all Not difficult at all Not difficult at all      Past Medical History:  Past Medical History:  Diagnosis Date   Mammographic microcalcification    Small intestinal bacterial overgrowth (SIBO)     Surgical History:  Past Surgical History:   Procedure Laterality Date   BREAST BIOPSY Left 2013   neg   COLONOSCOPY WITH PROPOFOL N/A 04/26/2020   Procedure: COLONOSCOPY WITH PROPOFOL;  Surgeon: Lin Landsman, MD;  Location: [facility] ENDOSCOPY;  Service: Gastroenterology;  Laterality: N/A;    Medications:  Current Outpatient Medications on File Prior to Visit  Medication Sig   Coenzyme Q10 (CO Q 10 PO) Take by mouth.   Multiple Vitamins-Minerals (MULTIVITAL PO) Take 1 tablet by mouth daily.   Omega-3 Fatty Acids (FISH OIL) 1000 MG CAPS Take 1 capsule by mouth daily.   UNABLE TO FIND Take 3 tablets by mouth daily. Med Name: A.D.P.   UNABLE TO FIND Take 2 capsules by mouth 2 (two) times daily with a meal. Med Name: FC-Cidal   UNABLE TO FIND Take 2 capsules by mouth 2 (two) times daily with a meal. Med Name: Dysbiocide   B Complex-Biotin-FA (BALANCE B-50 PO) Take 4 capsules by mouth daily.   No current facility-administered medications on file prior to visit.  Allergies:  No Known Allergies  Social History:  Social History   Socioeconomic History   Marital status: Divorced    Spouse name: Not on file   Number of children: Not on file   Years of education: Not on file   Highest education level: Not on file  Occupational History   Not on file  Tobacco Use   Smoking status: Never   Smokeless tobacco: Never  Vaping Use   Vaping Use: Never used  Substance and Sexual Activity   Alcohol use: No   Drug use: No   Sexual activity: Not Currently  Other Topics Concern   Not on file  Social History Narrative   Not on file   Social Determinants of Health   Financial Resource Strain: Not on file  Food Insecurity: Not on file  Transportation Needs: Not on file  Physical Activity: Sufficiently Active (08/07/2018)   Exercise Vital Sign    Days of Exercise per Week: 5 days    Minutes of Exercise per Session: 60 min  Stress: Not on file  Social Connections: Not on file  Intimate Partner Violence: Not on file    Social History   Tobacco Use  Smoking Status Never  Smokeless Tobacco Never   Social History   Substance and Sexual Activity  Alcohol Use No    Family History:  Family History  Problem Relation Age of Onset   Breast cancer Mother 23       BRACA NEG   Cancer Mother        breast   Ulcerative colitis Sister    Irritable bowel syndrome Sister    Breast cancer Paternal Aunt 60   Irritable bowel syndrome Paternal Aunt    Irritable bowel syndrome Paternal Uncle    Diabetes Paternal Grandmother    Hypertension Paternal Grandmother     Past medical history, surgical history, medications, allergies, family history and social history reviewed with patient today and changes made to appropriate areas of the chart.   Review of Systems  Constitutional: Negative.   HENT: Negative.    Eyes: Negative.   Respiratory: Negative.    Cardiovascular: Negative.   Gastrointestinal: Negative.   Genitourinary: Negative.   Musculoskeletal: Negative.   Skin: Negative.   Neurological: Negative.   Endo/Heme/Allergies: Negative.   Psychiatric/Behavioral: Negative.     All other ROS negative except what is listed above and in the HPI.      Objective:    BP 132/85   Pulse 85   Temp 98.4 F (36.9 C) (Oral)   Ht '5\' 6"'$  (1.676 m)   Wt 294 lb 8 oz (133.6 kg)   LMP 04/09/2016   SpO2 97%   BMI 47.53 kg/m   Wt Readings from Last 3 Encounters:  09/10/22 294 lb 8 oz (133.6 kg)  05/16/21 258 lb 3.2 oz (117.1 kg)  12/18/20 269 lb 9.6 oz (122.3 kg)    Physical Exam Vitals and nursing note reviewed.  Constitutional:      General: She is not in acute distress.    Appearance: Normal appearance. She is obese. She is not ill-appearing, toxic-appearing or diaphoretic.  HENT:     Head: Normocephalic and atraumatic.     Right Ear: Tympanic membrane, ear canal and external ear normal. There is no impacted cerumen.     Left Ear: Tympanic membrane, ear canal and external ear normal. There is no  impacted cerumen.     Nose: Nose normal. No congestion or rhinorrhea.  Mouth/Throat:     Mouth: Mucous membranes are moist.     Pharynx: Oropharynx is clear. No oropharyngeal exudate or posterior oropharyngeal erythema.  Eyes:     General: No scleral icterus.       Right eye: No discharge.        Left eye: No discharge.     Extraocular Movements: Extraocular movements intact.     Conjunctiva/sclera: Conjunctivae normal.     Pupils: Pupils are equal, round, and reactive to light.  Neck:     Vascular: No carotid bruit.  Cardiovascular:     Rate and Rhythm: Normal rate and regular rhythm.     Pulses: Normal pulses.     Heart sounds: No murmur heard.    No friction rub. No gallop.  Pulmonary:     Effort: Pulmonary effort is normal. No respiratory distress.     Breath sounds: Normal breath sounds. No stridor. No wheezing, rhonchi or rales.  Chest:     Chest wall: No tenderness.  Abdominal:     General: Abdomen is flat. Bowel sounds are normal. There is no distension.     Palpations: Abdomen is soft. There is no mass.     Tenderness: There is no abdominal tenderness. There is no right CVA tenderness, left CVA tenderness, guarding or rebound.     Hernia: No hernia is present.  Genitourinary:    Comments: Breast and pelvic exams deferred with shared decision making Musculoskeletal:        General: No swelling, tenderness, deformity or signs of injury.     Cervical back: Normal range of motion and neck supple. No rigidity. No muscular tenderness.     Right lower leg: No edema.     Left lower leg: No edema.  Lymphadenopathy:     Cervical: No cervical adenopathy.  Skin:    General: Skin is warm and dry.     Capillary Refill: Capillary refill takes less than 2 seconds.     Coloration: Skin is not jaundiced or pale.     Findings: No bruising, erythema, lesion or rash.  Neurological:     General: No focal deficit present.     Mental Status: She is alert and oriented to person,  place, and time. Mental status is at baseline.     Cranial Nerves: No cranial nerve deficit.     Sensory: No sensory deficit.     Motor: No weakness.     Coordination: Coordination normal.     Gait: Gait normal.     Deep Tendon Reflexes: Reflexes normal.  Psychiatric:        Mood and Affect: Mood normal.        Behavior: Behavior normal.        Thought Content: Thought content normal.        Judgment: Judgment normal.     Results for orders placed or performed in visit on 05/16/21  CBC with Differential/Platelet  Result Value Ref Range   WBC 6.2 3.4 - 10.8 x10E3/uL   RBC 5.11 3.77 - 5.28 x10E6/uL   Hemoglobin 13.5 11.1 - 15.9 g/dL   Hematocrit 42.2 34.0 - 46.6 %   MCV 83 79 - 97 fL   MCH 26.4 (L) 26.6 - 33.0 pg   MCHC 32.0 31.5 - 35.7 g/dL   RDW 13.0 11.7 - 15.4 %   Platelets 264 150 - 450 x10E3/uL   Neutrophils 51 Not Estab. %   Lymphs 35 Not Estab. %   Monocytes 9 Not  Estab. %   Eos 4 Not Estab. %   Basos 1 Not Estab. %   Neutrophils Absolute 3.2 1.4 - 7.0 x10E3/uL   Lymphocytes Absolute 2.2 0.7 - 3.1 x10E3/uL   Monocytes Absolute 0.6 0.1 - 0.9 x10E3/uL   EOS (ABSOLUTE) 0.2 0.0 - 0.4 x10E3/uL   Basophils Absolute 0.0 0.0 - 0.2 x10E3/uL   Immature Granulocytes 0 Not Estab. %   Immature Grans (Abs) 0.0 0.0 - 0.1 x10E3/uL  Comprehensive metabolic panel  Result Value Ref Range   Glucose 89 70 - 99 mg/dL   BUN 13 6 - 24 mg/dL   Creatinine, Ser 0.87 0.57 - 1.00 mg/dL   eGFR 82 >59 mL/min/1.73   BUN/Creatinine Ratio 15 9 - 23   Sodium 140 134 - 144 mmol/L   Potassium 4.5 3.5 - 5.2 mmol/L   Chloride 102 96 - 106 mmol/L   CO2 24 20 - 29 mmol/L   Calcium 9.8 8.7 - 10.2 mg/dL   Total Protein 7.3 6.0 - 8.5 g/dL   Albumin 4.8 3.8 - 4.8 g/dL   Globulin, Total 2.5 1.5 - 4.5 g/dL   Albumin/Globulin Ratio 1.9 1.2 - 2.2   Bilirubin Total 0.3 0.0 - 1.2 mg/dL   Alkaline Phosphatase 87 44 - 121 IU/L   AST 21 0 - 40 IU/L   ALT 15 0 - 32 IU/L  Lipid Panel w/o Chol/HDL Ratio   Result Value Ref Range   Cholesterol, Total 301 (H) 100 - 199 mg/dL   Triglycerides 104 0 - 149 mg/dL   HDL 76 >39 mg/dL   VLDL Cholesterol Cal 18 5 - 40 mg/dL   LDL Chol Calc (NIH) 207 (H) 0 - 99 mg/dL   Lipid Comment: Comment   Urinalysis, Routine w reflex microscopic  Result Value Ref Range   Specific Gravity, UA 1.015 1.005 - 1.030   pH, UA 6.0 5.0 - 7.5   Color, UA Yellow Yellow   Appearance Ur Clear Clear   Leukocytes,UA Negative Negative   Protein,UA Negative Negative/Trace   Glucose, UA Negative Negative   Ketones, UA Negative Negative   RBC, UA Negative Negative   Bilirubin, UA Negative Negative   Urobilinogen, Ur 0.2 0.2 - 1.0 mg/dL   Nitrite, UA Negative Negative  TSH  Result Value Ref Range   TSH 2.750 0.450 - 4.500 uIU/mL  VITAMIN D 25 Hydroxy (Vit-D Deficiency, Fractures)  Result Value Ref Range   Vit D, 25-Hydroxy 25.5 (L) 30.0 - 100.0 ng/mL  FSH  Result Value Ref Range   FSH 46.7 mIU/mL  LH  Result Value Ref Range   LH 32.7 mIU/mL  Estradiol  Result Value Ref Range   Estradiol <5.0 pg/mL  Testosterone, free, total([facility])  Result Value Ref Range   Testosterone 3 (L) 4 - 50 ng/dL   Testosterone, Free 0.3 0.0 - 4.2 pg/mL   Sex Hormone Binding 51.1 24.6 - 122.0 nmol/L      Assessment & Plan:   Problem List Items Addressed This Visit       Other   Hyperlipidemia    Under good control on current regimen. Continue current regimen. Continue to monitor. Call with any concerns. Refills given. Labs drawn today.       Relevant Medications   atorvastatin (LIPITOR) 40 MG tablet   Other Relevant Orders   Lipid Panel w/o Chol/HDL Ratio   Morbid obesity ([place])    Working on dietary changes. Will check A1c. Await results. Treat as needed.  Relevant Orders   Bayer DCA Hb A1c Waived   Other Visit Diagnoses     Routine general medical examination at a health care facility    -  Primary   Vaccines up to date/declined. Screening labs  checked today. Pap through GYN. Mammo ordered. Colonoscopy up to date. Continue diet and exercise. Call w concerns.   Relevant Orders   CBC with Differential/Platelet   Comprehensive metabolic panel   Lipid Panel w/o Chol/HDL Ratio   Urinalysis, Routine w reflex microscopic   TSH   Low zinc level       Rechecking labs today. Await results. Treat as needed.   Relevant Orders   Zinc   Encounter for screening mammogram for malignant neoplasm of breast       Mammogram ordered today.   Relevant Orders   MM 3D SCREENING MAMMOGRAM BILATERAL BREAST        Follow up plan: Return in about 1 year (around 09/10/2023) for physical (records release for [facility] for pap too please).   LABORATORY TESTING:  - Pap smear: done elsewhere  IMMUNIZATIONS:   - Tdap: Tetanus vaccination status reviewed: last tetanus booster within 10 years. - Influenza: Refused - Pneumovax: Not applicable - Prevnar: Not applicable - COVID: Up to date - HPV: Not applicable - Shingrix vaccine: Refused  SCREENING: -Mammogram: Ordered today  - Colonoscopy: Up to date   PATIENT COUNSELING:   Advised to take 1 mg of folate supplement per day if capable of pregnancy.   Sexuality: Discussed sexually transmitted diseases, partner selection, use of condoms, avoidance of unintended pregnancy  and contraceptive alternatives.   Advised to avoid cigarette smoking.  I discussed with the patient that most people either abstain from alcohol or drink within safe limits (<=14/week and <=4 drinks/occasion for males, <=7/weeks and <= 3 drinks/occasion for females) and that the risk for alcohol disorders and other health effects rises proportionally with the number of drinks per week and how often a drinker exceeds daily limits.  Discussed cessation/primary prevention of drug use and availability of treatment for abuse.   Diet: Encouraged to adjust caloric intake to maintain  or achieve ideal body weight, to reduce intake of  dietary saturated fat and total fat, to limit sodium intake by avoiding high sodium foods and not adding table salt, and to maintain adequate dietary potassium and calcium preferably from fresh fruits, vegetables, and low-fat dairy products.    stressed the importance of regular exercise  Injury prevention: Discussed safety belts, safety helmets, smoke detector, smoking near bedding or upholstery.   Dental health: Discussed importance of regular tooth brushing, flossing, and dental visits.    NEXT PREVENTATIVE PHYSICAL DUE IN 1 YEAR. Return in about 1 year (around 09/10/2023) for physical (records release for [facility] for pap too please).

## 2022-09-10 NOTE — Assessment & Plan Note (Signed)
Under good control on current regimen. Continue current regimen. Continue to monitor. Call with any concerns. Refills given. Labs drawn today.   

## 2022-09-10 NOTE — Patient Instructions (Signed)
Please call to schedule your mammogram and/or bone density: [facility]  Address: [address], [place], [place] [address] Phone: [phone]  [facility] [place] [address] [place],  [place]  [address] Phone: [phone]   

## 2022-09-13 LAB — COMPREHENSIVE METABOLIC PANEL
ALT: 18 IU/L (ref 0–32)
AST: 22 IU/L (ref 0–40)
Albumin/Globulin Ratio: 1.9 (ref 1.2–2.2)
Albumin: 4.5 g/dL (ref 3.9–4.9)
Alkaline Phosphatase: 80 IU/L (ref 44–121)
BUN/Creatinine Ratio: 13 (ref 9–23)
BUN: 13 mg/dL (ref 6–24)
Bilirubin Total: 0.3 mg/dL (ref 0.0–1.2)
CO2: 21 mmol/L (ref 20–29)
Calcium: 9.6 mg/dL (ref 8.7–10.2)
Chloride: 103 mmol/L (ref 96–106)
Creatinine, Ser: 1.03 mg/dL — ABNORMAL HIGH (ref 0.57–1.00)
Globulin, Total: 2.4 g/dL (ref 1.5–4.5)
Glucose: 79 mg/dL (ref 70–99)
Potassium: 4.3 mmol/L (ref 3.5–5.2)
Sodium: 140 mmol/L (ref 134–144)
Total Protein: 6.9 g/dL (ref 6.0–8.5)
eGFR: 66 mL/min/{1.73_m2} (ref >59)

## 2022-09-13 LAB — LIPID PANEL W/O CHOL/HDL RATIO
Cholesterol, Total: 171 mg/dL (ref 100–199)
HDL: 56 mg/dL (ref >39)
LDL Chol Calc (NIH): 102 mg/dL — ABNORMAL HIGH (ref 0–99)
Triglycerides: 69 mg/dL (ref 0–149)
VLDL Cholesterol Cal: 13 mg/dL (ref 5–40)

## 2022-09-13 LAB — CBC WITH DIFFERENTIAL/PLATELET
Basophils Absolute: 0 10*3/uL (ref 0.0–0.2)
Basos: 0 %
EOS (ABSOLUTE): 0.2 10*3/uL (ref 0.0–0.4)
Eos: 4 %
Hematocrit: 41.5 % (ref 34.0–46.6)
Hemoglobin: 13.5 g/dL (ref 11.1–15.9)
Immature Grans (Abs): 0 10*3/uL (ref 0.0–0.1)
Immature Granulocytes: 0 %
Lymphocytes Absolute: 1.8 10*3/uL (ref 0.7–3.1)
Lymphs: 33 %
MCH: 26.8 pg (ref 26.6–33.0)
MCHC: 32.5 g/dL (ref 31.5–35.7)
MCV: 82 fL (ref 79–97)
Monocytes Absolute: 0.6 10*3/uL (ref 0.1–0.9)
Monocytes: 10 %
Neutrophils Absolute: 2.9 10*3/uL (ref 1.4–7.0)
Neutrophils: 53 %
Platelets: 243 10*3/uL (ref 150–450)
RBC: 5.04 x10E6/uL (ref 3.77–5.28)
RDW: 12.4 % (ref 11.7–15.4)
WBC: 5.6 10*3/uL (ref 3.4–10.8)

## 2022-09-13 LAB — TSH: TSH: 2.45 u[IU]/mL (ref 0.450–4.500)

## 2022-09-13 LAB — ZINC: Zinc: 85 ug/dL (ref 44–115)

## 2022-10-17 ENCOUNTER — Ambulatory Visit
Admission: RE | Admit: 2022-10-17 | Discharge: 2022-10-17 | Disposition: A | Discharge status: Home / Self Care | Payer: Managed Care, Other (non HMO) | Source: Ambulatory Visit | Attending: Family Medicine | Admitting: Family Medicine

## 2022-10-17 DIAGNOSIS — Z1231 Encounter for screening mammogram for malignant neoplasm of breast: Secondary | ICD-10-CM | POA: Insufficient documentation

## 2022-11-10 IMAGING — MG MM DIGITAL SCREENING BILAT W/ TOMO AND CAD
8 series · 8 of 24 positions shown · non-contrast
Comparison: Previous exam(s).

CLINICAL DATA: Screening.

EXAM:
DIGITAL SCREENING BILATERAL MAMMOGRAM WITH TOMOSYNTHESIS AND CAD
TECHNIQUE: Bilateral screening digital craniocaudal and mediolateral oblique
mammograms were obtained. Bilateral screening digital breast
tomosynthesis was performed. The images were evaluated with
computer-aided detection.

[R CC synth-2D]
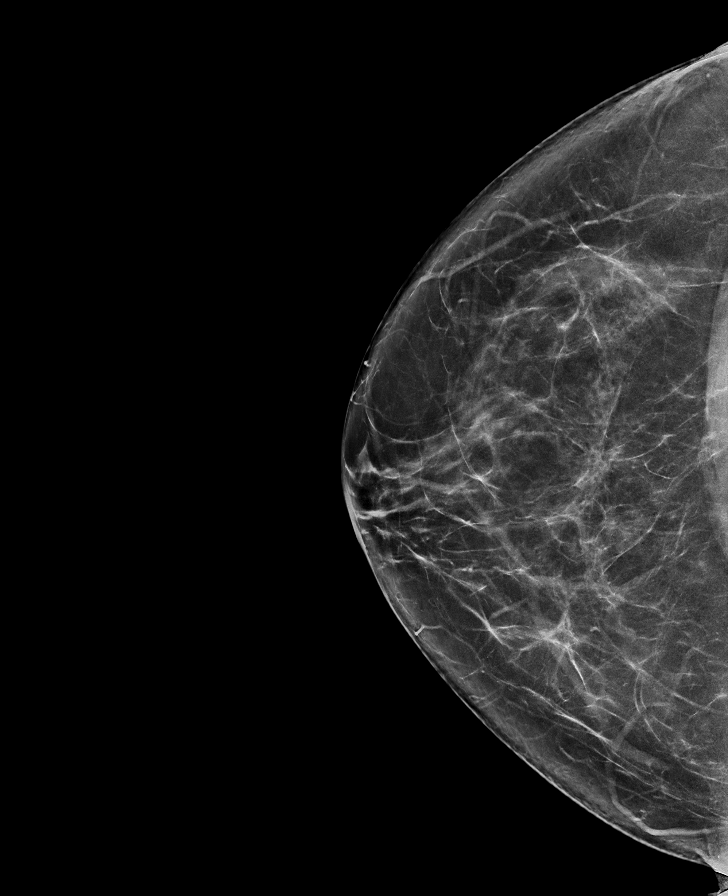

[L CC synth-2D]
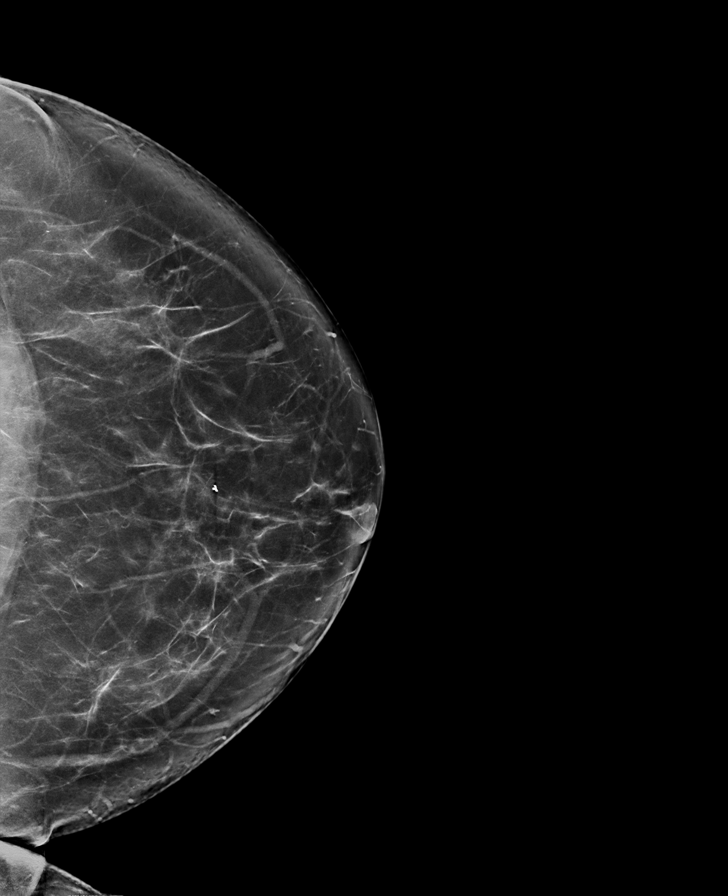

[R MLO synth-2D]
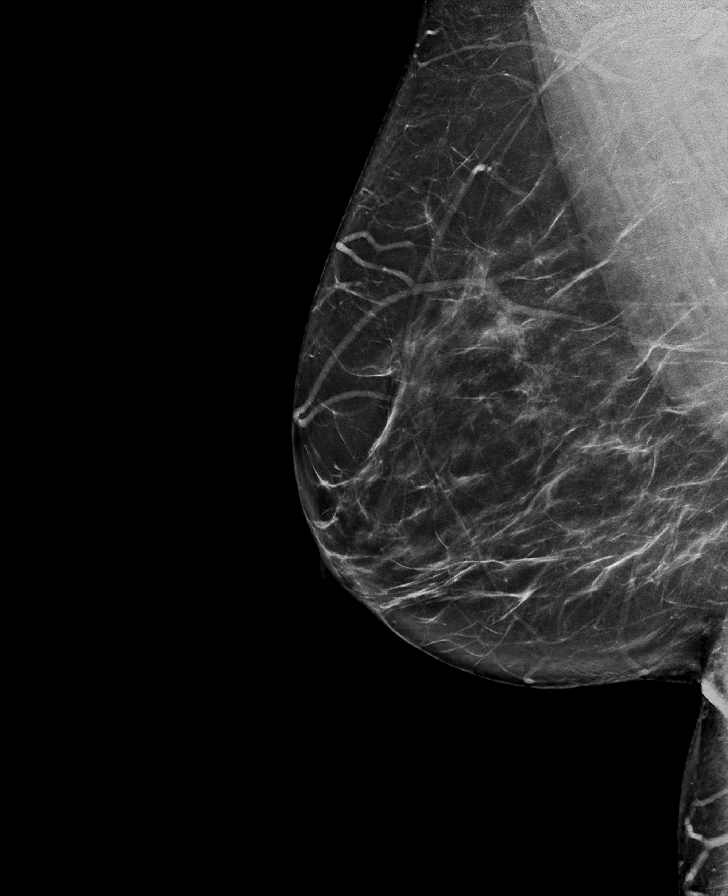

[L MLO synth-2D]
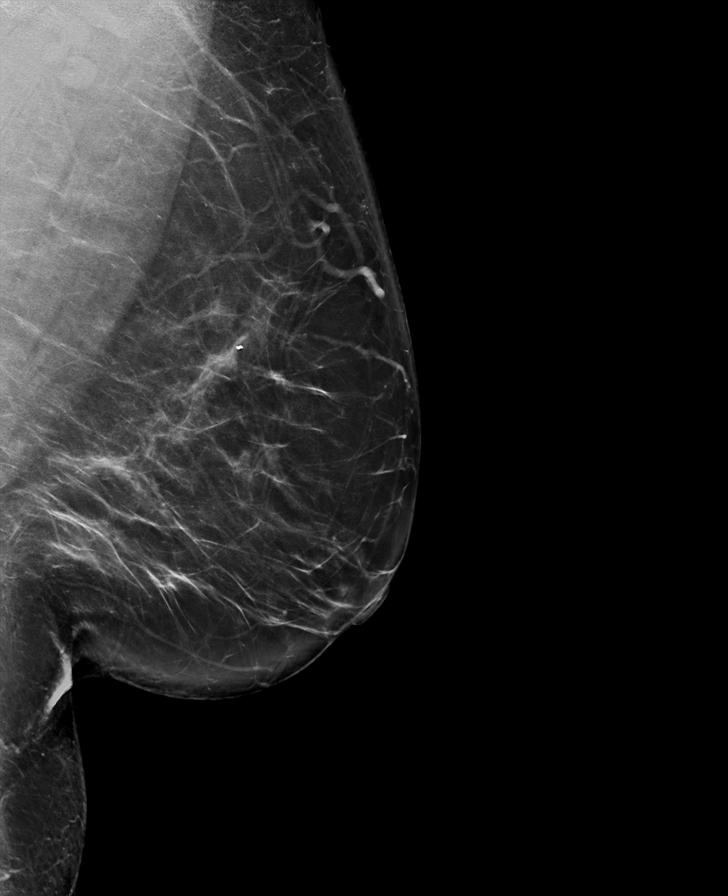

[R CC tomo · tomo slice 43/85.0]
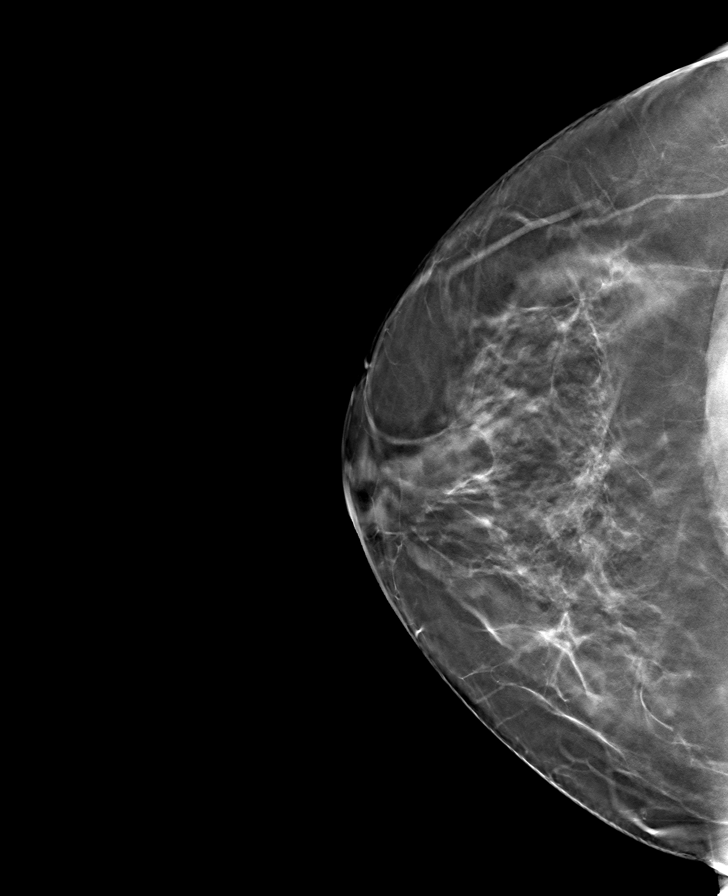

[R MLO tomo · tomo slice 45/88.0]
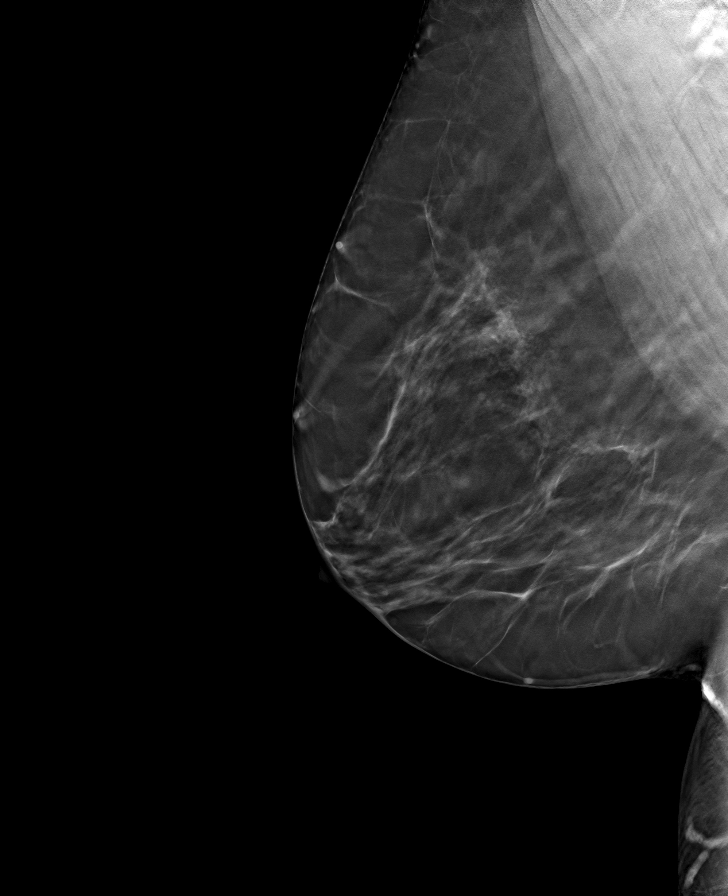

[L MLO tomo · tomo slice 49/97.0]
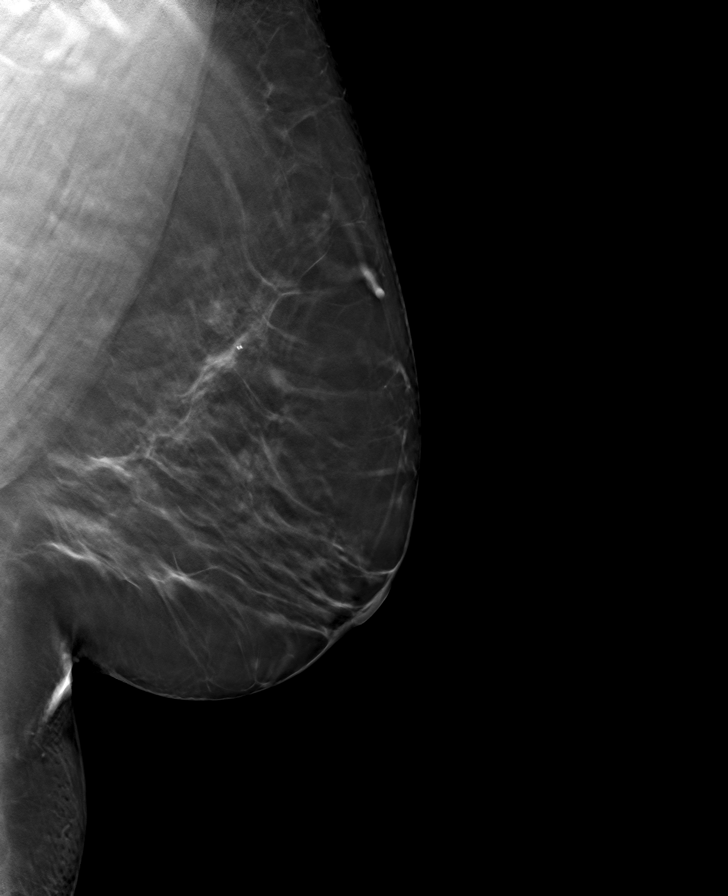

[L CC tomo · tomo slice 46/91.0]
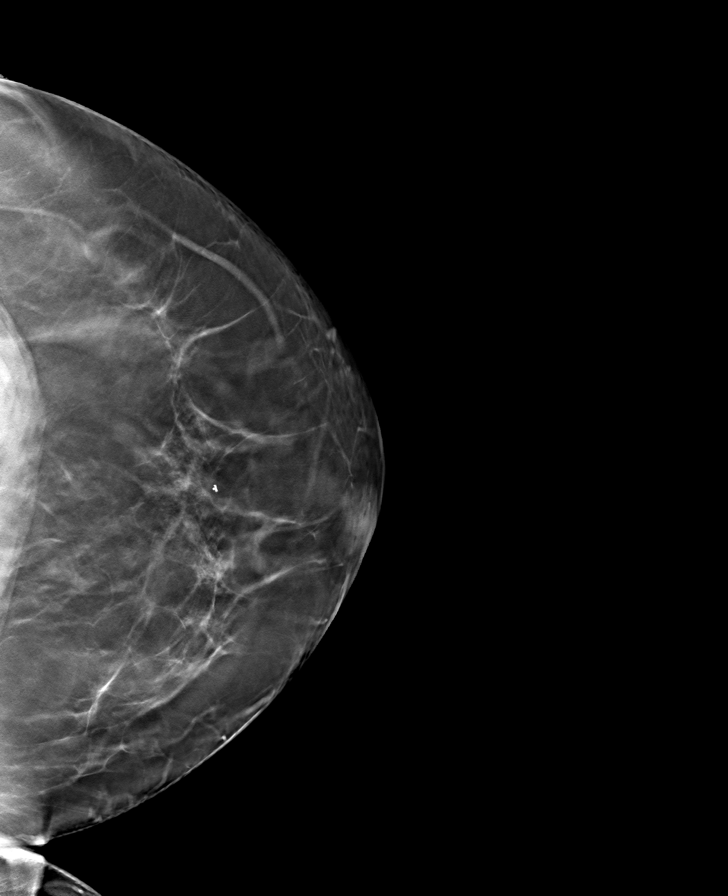

[8 of 24 positions shown; findings below may reference images not displayed]

ACR Breast Density Category b: There are scattered areas of
fibroglandular density.
FINDINGS: There are no findings suspicious for malignancy.
IMPRESSION: No mammographic evidence of malignancy. A result letter of this
screening mammogram will be mailed directly to the patient.

RECOMMENDATION:
Screening mammogram in one year. (Code:51-O-LD2)

BI-RADS CATEGORY  1: Negative.

## 2023-03-17 ENCOUNTER — Encounter: Payer: Self-pay | Admitting: Family Medicine

## 2023-03-18 NOTE — Telephone Encounter (Signed)
appt

## 2023-03-18 NOTE — Telephone Encounter (Signed)
My apologies at 10:20 am.

## 2023-03-18 NOTE — Telephone Encounter (Signed)
Called and scheduled patient on 03/25/2023 @ 10:40 am.

## 2023-03-25 ENCOUNTER — Ambulatory Visit: Payer: Managed Care, Other (non HMO) | Admitting: Family Medicine

## 2023-03-25 ENCOUNTER — Encounter: Payer: Self-pay | Admitting: Family Medicine

## 2023-03-25 VITALS — BP 136/88 | HR 89 | Temp 98.2°F | Ht 66.5 in | Wt 305.6 lb

## 2023-03-25 DIAGNOSIS — Z23 Encounter for immunization: Secondary | ICD-10-CM | POA: Diagnosis not present

## 2023-03-25 DIAGNOSIS — R030 Elevated blood-pressure reading, without diagnosis of hypertension: Secondary | ICD-10-CM

## 2023-03-25 LAB — MICROALBUMIN, URINE WAIVED
Creatinine, Urine Waived: 50 mg/dL (ref 10–300)
Microalb, Ur Waived: 10 mg/L (ref 0–19)

## 2023-03-25 NOTE — Progress Notes (Signed)
BP 136/88   Pulse 89   Temp 98.2 F (36.8 C) (Oral)   Ht 5' 6.5" (1.689 m)   Wt (!) 305 lb 9.6 oz (138.6 kg)   LMP 04/09/2016   SpO2 98%   BMI 48.59 kg/m    Subjective:    Patient ID: Jenna Fuller, female    DOB: 09/09/1970, 52 y.o.   MRN: [id number]  HPI: Jenna Fuller is a 52 y.o. female  Chief Complaint  Patient presents with   Hypertension    Patient states she had her BP checked at work at the end of August. States her reading was in the 170's over 97. States she was concerned with this reading.    ELEVATED BLOOD PRESSURE Duration of elevated BP: about a month BP monitoring frequency: a few times a week BP range: 170/90s, then like 140s-150s, stopped checking about a week ago Previous BP meds: no Recent stressors: no Family history of hypertension: unsure Recurrent headaches: no Visual changes: no Palpitations: no  Dyspnea: no Chest pain: no Lower extremity edema: no Dizzy/lightheaded: no Transient ischemic attacks: no  Relevant past medical, surgical, family and social history reviewed and updated as indicated. Interim medical history since our last visit reviewed. Allergies and medications reviewed and updated.  Review of Systems  Constitutional: Negative.   Respiratory: Negative.    Cardiovascular: Negative.   Gastrointestinal: Negative.   Musculoskeletal: Negative.   Psychiatric/Behavioral: Negative.      Per HPI unless specifically indicated above     Objective:    BP 136/88   Pulse 89   Temp 98.2 F (36.8 C) (Oral)   Ht 5' 6.5" (1.689 m)   Wt (!) 305 lb 9.6 oz (138.6 kg)   LMP 04/09/2016   SpO2 98%   BMI 48.59 kg/m   Wt Readings from Last 3 Encounters:  03/25/23 (!) 305 lb 9.6 oz (138.6 kg)  09/10/22 294 lb 8 oz (133.6 kg)  05/16/21 258 lb 3.2 oz (117.1 kg)    Physical Exam Vitals and nursing note reviewed.  Constitutional:      General: She is not in acute distress.    Appearance: Normal appearance. She is  not ill-appearing, toxic-appearing or diaphoretic.  HENT:     Head: Normocephalic and atraumatic.     Right Ear: External ear normal.     Left Ear: External ear normal.     Nose: Nose normal.     Mouth/Throat:     Mouth: Mucous membranes are moist.     Pharynx: Oropharynx is clear.  Eyes:     General: No scleral icterus.       Right eye: No discharge.        Left eye: No discharge.     Extraocular Movements: Extraocular movements intact.     Conjunctiva/sclera: Conjunctivae normal.     Pupils: Pupils are equal, round, and reactive to light.  Cardiovascular:     Rate and Rhythm: Normal rate and regular rhythm.     Pulses: Normal pulses.     Heart sounds: Normal heart sounds. No murmur heard.    No friction rub. No gallop.  Pulmonary:     Effort: Pulmonary effort is normal. No respiratory distress.     Breath sounds: Normal breath sounds. No stridor. No wheezing, rhonchi or rales.  Chest:     Chest wall: No tenderness.  Musculoskeletal:        General: Normal range of motion.     Cervical back: Normal  range of motion and neck supple.  Skin:    General: Skin is warm and dry.     Capillary Refill: Capillary refill takes less than 2 seconds.     Coloration: Skin is not jaundiced or pale.     Findings: No bruising, erythema, lesion or rash.  Neurological:     General: No focal deficit present.     Mental Status: She is alert and oriented to person, place, and time. Mental status is at baseline.  Psychiatric:        Mood and Affect: Mood normal.        Behavior: Behavior normal.        Thought Content: Thought content normal.        Judgment: Judgment normal.     Results for orders placed or performed in visit on 03/25/23  Microalbumin, Urine Waived  Result Value Ref Range   Microalb, Ur Waived 10 0 - 19 mg/L   Creatinine, Urine Waived 50 10 - 300 mg/dL   Microalb/Creat Ratio 30-300 (H) <30 mg/g      Assessment & Plan:   Problem List Items Addressed This Visit    None Visit Diagnoses     Elevated blood pressure reading    -  Primary   Will monitor at home and check back in by mychart in about a month. Call if BP is running consistently high.   Relevant Orders   Microalbumin, Urine Waived (Completed)   Need for COVID-19 vaccine       Covid shot given today.   Relevant Orders   Pfizer Comirnaty Covid -19 Vaccine 38yrs and older (Completed)        Follow up plan: Return if symptoms worsen or fail to improve.

## 2023-04-08 ENCOUNTER — Encounter: Payer: Self-pay | Admitting: Family Medicine

## 2023-04-09 NOTE — Telephone Encounter (Signed)
Scheduled patient on 04/11/2023 @ 9:00 am.

## 2023-04-11 ENCOUNTER — Ambulatory Visit: Payer: Managed Care, Other (non HMO) | Admitting: Family Medicine

## 2023-04-11 ENCOUNTER — Encounter: Payer: Self-pay | Admitting: Family Medicine

## 2023-04-11 DIAGNOSIS — R1084 Generalized abdominal pain: Secondary | ICD-10-CM | POA: Diagnosis not present
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
## 2023-04-12 LAB — TSH: TSH: 3.29 u[IU]/mL (ref 0.450–4.500)

## 2023-04-14 ENCOUNTER — Encounter: Payer: Self-pay | Admitting: Family Medicine

## 2023-04-14 MED ORDER — SEMAGLUTIDE-WEIGHT MANAGEMENT 0.25 MG/0.5ML ~~LOC~~ SOAJ: 0.2500 mg | SUBCUTANEOUS | 0 refills | Status: AC

## 2023-04-14 MED ORDER — SEMAGLUTIDE-WEIGHT MANAGEMENT 0.5 MG/0.5ML ~~LOC~~ SOAJ: 0.5000 mg | SUBCUTANEOUS | 0 refills | Status: AC

## 2023-04-14 MED ORDER — SEMAGLUTIDE-WEIGHT MANAGEMENT 1 MG/0.5ML ~~LOC~~ SOAJ: 1.0000 mg | SUBCUTANEOUS | 1 refills | Status: DC

## 2023-04-14 NOTE — Assessment & Plan Note (Signed)
Has tried diet and exercise without much benefit. Will check labs including 24 hour urine cortisol. Discussed weight loss medicine- she will read about them and let us know if she'd be interested in starting something. Call with any concerns. Recheck in 3 months if starting medicine.

## 2023-04-23 NOTE — Addendum Note (Signed)
Addended by: Pablo Ledger on: 04/23/2023 04:23 PM   Modules accepted: Orders

## 2023-04-29 LAB — CORTISOL, URINE, FREE
Cortisol (Ur), Free: 35 ug/(24.h) (ref 6–42)
Cortisol,F,ug/L,U: 18 ug/L

## 2023-05-28 ENCOUNTER — Encounter: Payer: Self-pay | Admitting: Endocrinology

## 2023-05-28 ENCOUNTER — Ambulatory Visit: Payer: Managed Care, Other (non HMO) | Admitting: Endocrinology

## 2023-05-28 MED ORDER — DEXAMETHASONE 1 MG PO TABS: ORAL_TABLET | ORAL | 0 refills | Status: DC

## 2023-05-28 NOTE — Patient Instructions (Signed)
1 mg dexamethasone overnight suppression test. Instruction: Take 1 mg dexamethasone tablet at 11 PM ( timing is important) and have blood work for cortisol test in the next morning at 8 AM with fasting (timing is important).
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
## 2023-07-04 ENCOUNTER — Other Ambulatory Visit: Payer: Self-pay | Admitting: Family Medicine

## 2023-07-04 NOTE — Telephone Encounter (Signed)
Medication Refill -  Most Recent Primary Care Visit:  Provider: Olevia Perches P  Department: [facility]  Visit Type: OFFICE VISIT  Date: 04/11/2023  Medication: Semaglutide-Weight Management 1 MG/0.5ML SOAJ  Ozempic - Needs dose increase she says  Has the patient contacted their pharmacy? Yes  Is this the correct pharmacy for this prescription? Yes If no, delete pharmacy and type the correct one.  This is the patient's preferred pharmacy:  [facility] [address] [place] - [address] [place] [address] Phone: [phone] Fax: [phone]  Has the prescription been filled recently? Yes  Is the patient out of the medication? Yes  Has the patient been seen for an appointment in the last year OR does the patient have an upcoming appointment? Yes  Can we respond through MyChart? Yes  Agent: Please be advised that Rx refills may take up to 3 business days. We ask that you follow-up with your pharmacy.

## 2023-07-08 MED ORDER — SEMAGLUTIDE-WEIGHT MANAGEMENT 1 MG/0.5ML ~~LOC~~ SOAJ: 1.0000 mg | SUBCUTANEOUS | 0 refills | Status: DC

## 2023-07-08 NOTE — Telephone Encounter (Signed)
 Requested Prescriptions  Pending Prescriptions Disp Refills   Semaglutide -Weight Management 1 MG/0.5ML SOAJ 2 mL 0    Sig: Inject 1 mg into the skin once a week for 28 days.     Endocrinology:  Diabetes - GLP-1 Receptor Agonists - semaglutide  Failed - 07/08/2023  5:31 PM      Failed - HBA1C in normal range and within 180 days    HB A1C (BAYER DCA - WAIVED)  Date Value Ref Range Status  09/10/2022 6.1 (H) 4.8 - 5.6 % Final    Comment:             Prediabetes: 5.7 - 6.4          Diabetes: >6.4          Glycemic control for adults with diabetes: <7.0    A1c  Date Value Ref Range Status  01/03/2022 6.1  Final         Failed - Cr in normal range and within 360 days    Creatinine, Ser  Date Value Ref Range Status  09/10/2022 1.03 (H) 0.57 - 1.00 mg/dL Final         Passed - Valid encounter within last 6 months    Recent Outpatient Visits           2 months ago Morbid obesity (HCC)   [facility] [place], Megan P, DO   3 months ago Elevated blood pressure reading   [facility] [place], Megan P, DO   10 months ago Routine general medical examination at a health care facility   [facility] [place], [place] P, DO   2 years ago Routine general medical examination at a health care facility   [facility] [place], [place] P, DO   2 years ago Right ankle swelling   [facility] Nedra Tinnie LABOR, NP       Future Appointments             In 6 days Vicci Duwaine SQUIBB, DO  [facility]

## 2023-07-14 ENCOUNTER — Ambulatory Visit: Payer: Managed Care, Other (non HMO) | Admitting: Family Medicine

## 2023-07-14 ENCOUNTER — Telehealth: Payer: Self-pay

## 2023-07-14 ENCOUNTER — Other Ambulatory Visit: Payer: Self-pay | Admitting: Family Medicine

## 2023-07-14 NOTE — Telephone Encounter (Signed)
 Prior authorization was initiated via CoverMyMeds for Wegovy 1 MG. Awaiting determination.  WUJ:[id number]

## 2023-07-15 ENCOUNTER — Telehealth: Payer: Self-pay

## 2023-07-16 ENCOUNTER — Telehealth: Payer: Self-pay

## 2023-07-16 NOTE — Telephone Encounter (Signed)
 Patient given results  as directed by MD. No further questions at this time.

## 2023-07-16 NOTE — Telephone Encounter (Signed)
-----   Message from Sudan Thapa sent at 07/16/2023  8:09 AM EST ----- Please notify patient of normal cortisol level with dexamethasone  suppression test, negative result for hypercortisolism/Cushing's syndrome.  No routine endocrinology follow-up is required.  Continue to follow-up with primary care provider/weight management.

## 2023-07-16 NOTE — Telephone Encounter (Signed)
 Entered in error

## 2023-07-17 NOTE — Telephone Encounter (Signed)
 Requested Prescriptions  Refused Prescriptions Disp Refills   atorvastatin  (LIPITOR) 40 MG tablet [Pharmacy Med Name: Atorvastatin  Calcium  40 MG Oral Tablet] 90 tablet 3    Sig: TAKE 1 TABLET BY MOUTH DAILY     Cardiovascular:  Antilipid - Statins Failed - 07/17/2023  9:34 AM      Failed - Lipid Panel in normal range within the last 12 months    Cholesterol, Total  Date Value Ref Range Status  09/10/2022 171 100 - 199 mg/dL Final   LDL Chol Calc (NIH)  Date Value Ref Range Status  09/10/2022 102 (H) 0 - 99 mg/dL Final   HDL  Date Value Ref Range Status  09/10/2022 56 >39 mg/dL Final   Triglycerides  Date Value Ref Range Status  09/10/2022 69 0 - 149 mg/dL Final         Passed - Patient is not pregnant      Passed - Valid encounter within last 12 months    Recent Outpatient Visits           3 months ago Morbid obesity (HCC)   [facility] [place], Megan P, DO   3 months ago Elevated blood pressure reading   [facility] [place], Megan P, DO   10 months ago Routine general medical examination at a health care facility   [facility] [place], [place] P, DO   2 years ago Routine general medical examination at a health care facility   [facility] [place], [place] P, DO   2 years ago Right ankle swelling    [facility] [place], Tinnie LABOR, NP

## 2023-07-21 NOTE — Telephone Encounter (Signed)
 Notified patient via MyChart to update her about the denial of her prior authorization.

## 2023-07-21 NOTE — Telephone Encounter (Signed)
 Patient called to get an update on the PA for [facility].

## 2023-07-24 LAB — CORTISOL: Cortisol: 1 ug/dL — ABNORMAL LOW (ref 6.2–19.4)

## 2023-07-24 LAB — DEXAMETHASONE, BLOOD: Dexamethasone, Blood: 400 ng/dL

## 2023-07-25 ENCOUNTER — Telehealth: Payer: Self-pay | Admitting: Family Medicine

## 2023-07-25 NOTE — Telephone Encounter (Signed)
Copied from CRM [id number]. Topic: General - Other >> Jul 25, 2023  1:53 PM Macon Large wrote: Reason for CRM: Destiny with [employer] prior authorization dept reports that there is an approval on file for [facility] through 10/23/23. Cb# [phone]

## 2023-07-25 NOTE — Telephone Encounter (Signed)
Patient was made aware of approval via MyChart message. Advised to give our office a call back if she has any other questions or concerns.

## 2023-07-28 ENCOUNTER — Encounter: Payer: Self-pay | Admitting: Family Medicine

## 2023-08-05 ENCOUNTER — Ambulatory Visit: Payer: Managed Care, Other (non HMO) | Admitting: Family Medicine

## 2023-08-05 ENCOUNTER — Encounter: Payer: Self-pay | Admitting: Family Medicine

## 2023-08-05 DIAGNOSIS — Z23 Encounter for immunization: Secondary | ICD-10-CM | POA: Diagnosis not present

## 2023-08-05 MED ORDER — WEGOVY 2.4 MG/0.75ML ~~LOC~~ SOAJ: 2.4000 mg | SUBCUTANEOUS | 3 refills | Status: DC

## 2023-08-05 MED ORDER — WEGOVY 1.7 MG/0.75ML ~~LOC~~ SOAJ: 1.7000 mg | SUBCUTANEOUS | 1 refills | Status: DC

## 2023-08-05 NOTE — Progress Notes (Signed)
BP (!) 145/90   Pulse 93   Temp 98 F (36.7 C) (Oral)   Wt (!) 306 lb 6.4 oz (139 kg)   LMP 04/09/2016   SpO2 97%   BMI 48.71 kg/m    Subjective:    Patient ID: Jenna Fuller, female    DOB: 1970/08/10, 53 y.o.   MRN: [id number]  HPI: Jenna Fuller is a 53 y.o. female  Chief Complaint  Patient presents with   Weight Check    Patient says she is currently taking 1 MG and has not noticed any weight loss. Patient says her Endocrinologist informed her that she may not see any weight loss until the 2.4 MG dose. Patient says this is the second month that she has been on her current dose.    OBESITY Duration: chronic Previous attempts at weight loss: yes- continues to exercise at least 3x a week, has been doing weight watchers, has been keeping up with diet. Has not lost any weight and it keeps going up slowly  Complications of obesity: HLD Peak weight: 306 Weight loss goal: to be healthy  Weight loss to date: none Requesting obesity pharmacotherapy: yes Current weight loss supplements/medications: yes Previous weight loss supplements/meds: no Calories:    Relevant past medical, surgical, family and social history reviewed and updated as indicated. Interim medical history since our last visit reviewed. Allergies and medications reviewed and updated.  Review of Systems  Constitutional: Negative.   Respiratory: Negative.    Cardiovascular: Negative.   Musculoskeletal: Negative.   Psychiatric/Behavioral: Negative.      Per HPI unless specifically indicated above     Objective:    BP (!) 145/90   Pulse 93   Temp 98 F (36.7 C) (Oral)   Wt (!) 306 lb 6.4 oz (139 kg)   LMP 04/09/2016   SpO2 97%   BMI 48.71 kg/m   Wt Readings from Last 3 Encounters:  08/05/23 (!) 306 lb 6.4 oz (139 kg)  05/28/23 (!) 305 lb 12.8 oz (138.7 kg)  04/11/23 (!) 306 lb 3.2 oz (138.9 kg)    Physical Exam Vitals and nursing note reviewed.  Constitutional:       General: She is not in acute distress.    Appearance: Normal appearance. She is not ill-appearing, toxic-appearing or diaphoretic.  HENT:     Head: Normocephalic and atraumatic.     Right Ear: External ear normal.     Left Ear: External ear normal.     Nose: Nose normal.     Mouth/Throat:     Mouth: Mucous membranes are moist.     Pharynx: Oropharynx is clear.  Eyes:     General: No scleral icterus.       Right eye: No discharge.        Left eye: No discharge.     Extraocular Movements: Extraocular movements intact.     Conjunctiva/sclera: Conjunctivae normal.     Pupils: Pupils are equal, round, and reactive to light.  Cardiovascular:     Rate and Rhythm: Normal rate and regular rhythm.     Pulses: Normal pulses.     Heart sounds: Normal heart sounds. No murmur heard.    No friction rub. No gallop.  Pulmonary:     Effort: Pulmonary effort is normal. No respiratory distress.     Breath sounds: Normal breath sounds. No stridor. No wheezing, rhonchi or rales.  Chest:     Chest wall: No tenderness.  Musculoskeletal:  General: Normal range of motion.     Cervical back: Normal range of motion and neck supple.  Skin:    General: Skin is warm and dry.     Capillary Refill: Capillary refill takes less than 2 seconds.     Coloration: Skin is not jaundiced or pale.     Findings: No bruising, erythema, lesion or rash.  Neurological:     General: No focal deficit present.     Mental Status: She is alert and oriented to person, place, and time. Mental status is at baseline.  Psychiatric:        Mood and Affect: Mood normal.        Behavior: Behavior normal.        Thought Content: Thought content normal.        Judgment: Judgment normal.     Results for orders placed or performed in visit on 05/28/23  Dexamethasone, blood   Collection Time: 07/15/23  7:11 AM  Result Value Ref Range   Dexamethasone, Blood 400 ng/dL  Cortisol   Collection Time: 07/15/23  7:11 AM  Result  Value Ref Range   Cortisol 1.0 (L) 6.2 - 19.4 ug/dL      Assessment & Plan:   Problem List Items Addressed This Visit       Other   Morbid obesity (HCC) - Primary   Has not lost any weight yet on wegovy- currently on the 1mg  dose. Will continue diet and exercise and titrate up to 2.4mg . Recheck 8 weeks.       Relevant Medications   Semaglutide-Weight Management (WEGOVY) 1.7 MG/0.75ML SOAJ   Semaglutide-Weight Management (WEGOVY) 2.4 MG/0.75ML SOAJ (Start on 09/02/2023)     Follow up plan: Return in about 2 months (around 10/03/2023).

## 2023-08-05 NOTE — Assessment & Plan Note (Signed)
Has not lost any weight yet on wegovy- currently on the 1mg  dose. Will continue diet and exercise and titrate up to 2.4mg . Recheck 8 weeks.

## 2023-08-21 ENCOUNTER — Ambulatory Visit: Payer: Self-pay | Admitting: *Deleted

## 2023-08-21 NOTE — Telephone Encounter (Signed)
  Chief Complaint: medication dosing:1.7ML Semaglutide Symptoms: 1.7ML Semaglutide was sent to pharmacy with RF- Patient was advise to use 1 month and increase to 2.5- so when she called for RF - she got decreased dose- Patient is going to follow up with the pharmacy and see if she can obtain the dose she needs   Disposition: [] ED /[] Urgent Care (no appt availability in office) / [] Appointment(In office/virtual)/ []  [place] [facility]/ [] Home Care/ [] Refused Recommended Disposition /[] [place] [facility]/ [x]  Follow-up with PCP Additional Notes: Patient advised contact pharmacy for her options- they may be able to fill the higher dose since it is a separate Rx- if they can please return the injectable she has for proper disposal.  She will follow up with PCP if needed for advice- she is concerned that her insurance will stop paying for Rx if no weight loss is shown.

## 2023-08-21 NOTE — Telephone Encounter (Signed)
Summary: Medication Managment   Caller states she picked up  1.7ML Semaglutide-Weight Management (WEGOVY) from the pharmacy on 1/28 and the pharmacy called her back to pick up another script which was for the 1.7. Patient is confused because it should have been for 2.5., patient states she did not realize it was another 1.7 until she  got home.       [facility] [place], [place] - [address] Phone: [phone] Fax: [phone]        Reason for Disposition . [1] Caller has URGENT medicine question about med that PCP or specialist prescribed AND [2] triager unable to answer question  Answer Assessment - Initial Assessment Questions 1. NAME of MEDICINE: "What medicine(s) are you calling about?"     1.7ML Semaglutide 2. QUESTION: "What is your question?" (e.g., double dose of medicine, side effect)     Patient reports she has taken 1 week of 1.31ml- and went to refill 2.31ml and received another 1.64ml. Patient states she was supposed to up dose after 1 month on 1.68ml 3. PRESCRIBER: "Who prescribed the medicine?" Reason: if prescribed by specialist, call should be referred to that group.     PCP Patient advised on how Rx was sent at her appointment and that the 1.7 dosing was sent with RF- she is going to go to the pharmacy to see if they can help her obtain the 2.5 dosing and she will return the 1.7 for next month is they will let her fill the 2.5. Patient is concerned if she is not showing weight loss - her insurance will not pay for Rx. Patient advised to call back if she has problems with obtaining what she needs.  Protocols used: Medication Question Call-A-AH

## 2023-09-10 ENCOUNTER — Ambulatory Visit: Payer: Self-pay | Admitting: Family Medicine

## 2023-09-11 ENCOUNTER — Other Ambulatory Visit: Payer: Self-pay | Admitting: Family Medicine

## 2023-09-11 NOTE — Telephone Encounter (Signed)
 Copied from CRM [id number]. Topic: Clinical - Medication Refill >> Sep 11, 2023 10:14 AM Carlatta H wrote: Most Recent Primary Care Visit:  Provider: Dorcas Carrow  Department: [id number]  Visit Type: OFFICE VISIT  Date: 08/05/2023  Medication:atorvastatin (LIPITOR) 40 MG tablet [[id number]  Has the patient contacted their pharmacy? No (Agent: If no, request that the patient contact the pharmacy for the refill. If patient does not wish to contact the pharmacy document the reason why and proceed with request.) (Agent: If yes, when and what did the pharmacy advise?)  Is this the correct pharmacy for this prescription? Yes If no, delete pharmacy and type the correct one.  This is the patient's preferred pharmacy:    [facility] - [place], [place] - [address] [place] [address] Phone: [phone] Fax: [phone]   Has the prescription been filled recently? No  Is the patient out of the medication? Yes  Has the patient been seen for an appointment in the last year OR does the patient have an upcoming appointment? Yes  Can we respond through MyChart? No  Agent: Please be advised that Rx refills may take up to 3 business days. We ask that you follow-up with your pharmacy.

## 2023-09-12 ENCOUNTER — Other Ambulatory Visit: Payer: Self-pay

## 2023-09-12 ENCOUNTER — Other Ambulatory Visit: Payer: Self-pay | Admitting: Family Medicine

## 2023-09-12 MED ORDER — ATORVASTATIN CALCIUM 40 MG PO TABS: 40.0000 mg | ORAL_TABLET | Freq: Every day | ORAL | 0 refills | Status: DC

## 2023-09-12 NOTE — Telephone Encounter (Signed)
 Requested Prescriptions  Pending Prescriptions Disp Refills   atorvastatin (LIPITOR) 40 MG tablet 90 tablet 0    Sig: Take 1 tablet (40 mg total) by mouth daily.     Cardiovascular:  Antilipid - Statins Failed - 09/12/2023  8:55 AM      Failed - Lipid Panel in normal range within the last 12 months    Cholesterol, Total  Date Value Ref Range Status  09/10/2022 171 100 - 199 mg/dL Final   LDL Chol Calc (NIH)  Date Value Ref Range Status  09/10/2022 102 (H) 0 - 99 mg/dL Final   HDL  Date Value Ref Range Status  09/10/2022 56 >39 mg/dL Final   Triglycerides  Date Value Ref Range Status  09/10/2022 69 0 - 149 mg/dL Final         Passed - Patient is not pregnant      Passed - Valid encounter within last 12 months    Recent Outpatient Visits           1 month ago Morbid obesity (HCC)   [facility] [place], Megan P, DO   5 months ago Morbid obesity [facility])   [place] [facility] [place], Megan P, DO   5 months ago Elevated blood pressure reading   [place] [facility] [place], Megan P, DO   1 year ago Routine general medical examination at a health care facility   [facility] [place], [place] P, DO   2 years ago Routine general medical examination at a health care facility   [facility] Dorcas Carrow, DO       Future Appointments             In 2 weeks Dorcas Carrow, DO [place] [facility]

## 2023-09-12 NOTE — Telephone Encounter (Signed)
 Requested Prescriptions  Refused Prescriptions Disp Refills   atorvastatin (LIPITOR) 40 MG tablet [Pharmacy Med Name: Atorvastatin Calcium 40 MG Oral Tablet] 90 tablet 3    Sig: TAKE 1 TABLET BY MOUTH DAILY     Cardiovascular:  Antilipid - Statins Failed - 09/12/2023  5:00 PM      Failed - Lipid Panel in normal range within the last 12 months    Cholesterol, Total  Date Value Ref Range Status  09/10/2022 171 100 - 199 mg/dL Final   LDL Chol Calc (NIH)  Date Value Ref Range Status  09/10/2022 102 (H) 0 - 99 mg/dL Final   HDL  Date Value Ref Range Status  09/10/2022 56 >39 mg/dL Final   Triglycerides  Date Value Ref Range Status  09/10/2022 69 0 - 149 mg/dL Final         Passed - Patient is not pregnant      Passed - Valid encounter within last 12 months    Recent Outpatient Visits           1 month ago Morbid obesity (HCC)   [place] [facility] [place], Megan P, DO   5 months ago Morbid obesity [facility])   [place] [facility] [place], Megan P, DO   5 months ago Elevated blood pressure reading   [facility] [facility] [place], Megan P, DO   1 year ago Routine general medical examination at a health care facility   [facility] [place], [place] P, DO   2 years ago Routine general medical examination at a health care facility   [facility] Dorcas Carrow, DO       Future Appointments             In 2 weeks Dorcas Carrow, DO [facility], [facility]

## 2023-09-12 NOTE — Telephone Encounter (Signed)
 RX t'd up to be sent to [facility] as requested.   Copied from CRM [id number]. Topic: Clinical - Prescription Issue >> Sep 12, 2023 10:54 AM Ivette P wrote: Reason for CRM: pt called in because yesterday requested a [facility]ill for [facility]. Pt is requesting prescription to be sent ot [facility].   Medication: atorvastatin (LIPITOR) 40 MG tablet   [facility] - [place], [place] - [address] [place] [address] Phone: [phone] Fax: [phone]

## 2023-09-30 ENCOUNTER — Ambulatory Visit: Payer: Managed Care, Other (non HMO) | Admitting: Family Medicine

## 2023-09-30 ENCOUNTER — Encounter: Payer: Self-pay | Admitting: Family Medicine

## 2023-09-30 NOTE — Progress Notes (Signed)
 Patient questions about preauthorizing her Reginal Lutes nearing its end of the current authorization. She understands most insurances require patients to have lost 5% body fat in order to approve again. She is currently at 3.33% body fat loss. We discussed having her come back for a nurse only visit on 10/21/2023 for a weight only check to allow a new authorization with most current weight to be submitted on her behalf.

## 2023-09-30 NOTE — Assessment & Plan Note (Signed)
 Congratulated patient on 10lb weight loss! Has been on 2.4mg  for 3 weeks- will continue her 2.4mg  and recheck in about 3 weeks. Needs to loose an additional 5 lbs to meet 5% weight loss. Call with any concerns.

## 2023-09-30 NOTE — Progress Notes (Signed)
 Wt 296 lb (134.3 kg)   LMP 04/09/2016   BMI 47.06 kg/m    Subjective:    Patient ID: Jenna Fuller, female    DOB: 09-Nov-1970, 53 y.o.   MRN: [id number]  HPI: Jenna Fuller is a 53 y.o. female  Chief Complaint  Patient presents with   Obesity   OBESITY- has been on the 2.4mg  for 3 weeks Duration: chronic Previous attempts at weight loss: yes Complications of obesity:  Peak weight: 306lbs Weight loss goal: to be healthy Weight loss to date: 10lbs Requesting obesity pharmacotherapy: yes Current weight loss supplements/medications: yes Previous weight loss supplements/meds: no   Relevant past medical, surgical, family and social history reviewed and updated as indicated. Interim medical history since our last visit reviewed. Allergies and medications reviewed and updated.  Review of Systems  Constitutional: Negative.   Respiratory: Negative.    Cardiovascular: Negative.   Gastrointestinal:  Positive for constipation and nausea. Negative for abdominal distention, abdominal pain, anal bleeding, blood in stool, diarrhea, rectal pain and vomiting.  Psychiatric/Behavioral: Negative.      Per HPI unless specifically indicated above     Objective:    Wt 296 lb (134.3 kg)   LMP 04/09/2016   BMI 47.06 kg/m   Wt Readings from Last 3 Encounters:  09/30/23 296 lb (134.3 kg)  08/05/23 (!) 306 lb 6.4 oz (139 kg)  05/28/23 (!) 305 lb 12.8 oz (138.7 kg)    Physical Exam Vitals and nursing note reviewed.  Constitutional:      General: She is not in acute distress.    Appearance: Normal appearance. She is obese. She is not ill-appearing, toxic-appearing or diaphoretic.  HENT:     Head: Normocephalic and atraumatic.     Right Ear: External ear normal.     Left Ear: External ear normal.     Nose: Nose normal.     Mouth/Throat:     Mouth: Mucous membranes are moist.     Pharynx: Oropharynx is clear.  Eyes:     General: No scleral icterus.       Right  eye: No discharge.        Left eye: No discharge.     Extraocular Movements: Extraocular movements intact.     Conjunctiva/sclera: Conjunctivae normal.     Pupils: Pupils are equal, round, and reactive to light.  Cardiovascular:     Rate and Rhythm: Normal rate and regular rhythm.     Pulses: Normal pulses.     Heart sounds: Normal heart sounds. No murmur heard.    No friction rub. No gallop.  Pulmonary:     Effort: Pulmonary effort is normal. No respiratory distress.     Breath sounds: Normal breath sounds. No stridor. No wheezing, rhonchi or rales.  Chest:     Chest wall: No tenderness.  Musculoskeletal:        General: Normal range of motion.     Cervical back: Normal range of motion and neck supple.  Skin:    General: Skin is warm and dry.     Capillary Refill: Capillary refill takes less than 2 seconds.     Coloration: Skin is not jaundiced or pale.     Findings: No bruising, erythema, lesion or rash.  Neurological:     General: No focal deficit present.     Mental Status: She is alert and oriented to person, place, and time. Mental status is at baseline.  Psychiatric:  Mood and Affect: Mood normal.        Behavior: Behavior normal.        Thought Content: Thought content normal.        Judgment: Judgment normal.     Results for orders placed or performed in visit on 05/28/23  Dexamethasone, blood   Collection Time: 07/15/23  7:11 AM  Result Value Ref Range   Dexamethasone, Blood 400 ng/dL  Cortisol   Collection Time: 07/15/23  7:11 AM  Result Value Ref Range   Cortisol 1.0 (L) 6.2 - 19.4 ug/dL      Assessment & Plan:   Problem List Items Addressed This Visit       Other   Morbid obesity (HCC) - Primary   Congratulated patient on 10lb weight loss! Has been on 2.4mg  for 3 weeks- will continue her 2.4mg  and recheck in about 3 weeks. Needs to loose an additional 5 lbs to meet 5% weight loss. Call with any concerns.         Follow up plan: Return in  about 3 weeks (around 10/21/2023).

## 2023-10-16 ENCOUNTER — Encounter: Payer: Self-pay | Admitting: Family Medicine

## 2023-10-16 ENCOUNTER — Ambulatory Visit: Admitting: Family Medicine

## 2023-10-16 VITALS — BP 160/90 | HR 80 | Ht 65.0 in | Wt 286.0 lb

## 2023-10-16 DIAGNOSIS — Z1231 Encounter for screening mammogram for malignant neoplasm of breast: Secondary | ICD-10-CM | POA: Diagnosis not present

## 2023-10-16 DIAGNOSIS — I10 Essential (primary) hypertension: Secondary | ICD-10-CM | POA: Insufficient documentation

## 2023-10-16 DIAGNOSIS — Z0000 Encounter for general adult medical examination without abnormal findings: Secondary | ICD-10-CM | POA: Diagnosis not present

## 2023-10-16 DIAGNOSIS — Z23 Encounter for immunization: Secondary | ICD-10-CM | POA: Diagnosis not present

## 2023-10-16 DIAGNOSIS — E785 Hyperlipidemia, unspecified: Secondary | ICD-10-CM

## 2023-10-16 LAB — BAYER DCA HB A1C WAIVED: HB A1C (BAYER DCA - WAIVED): 5.8 % — ABNORMAL HIGH (ref 4.8–5.6)

## 2023-10-16 LAB — MICROALBUMIN, URINE WAIVED
Creatinine, Urine Waived: 300 mg/dL (ref 10–300)
Microalb, Ur Waived: 80 mg/L — ABNORMAL HIGH (ref 0–19)

## 2023-10-16 MED ORDER — LISINOPRIL 10 MG PO TABS: 10.0000 mg | ORAL_TABLET | Freq: Every day | ORAL | 0 refills | Status: DC

## 2023-10-16 MED ORDER — WEGOVY 2.4 MG/0.75ML ~~LOC~~ SOAJ: 2.4000 mg | SUBCUTANEOUS | 1 refills | Status: DC

## 2023-10-16 NOTE — Assessment & Plan Note (Signed)
 Will start her on 10mg  lisinopril and recheck in 6 weeks. Call with any concerns.

## 2023-10-16 NOTE — Progress Notes (Signed)
 BP (!) 160/90   Pulse 80   Ht 5\' 5"  (1.651 m)   Wt 286 lb (129.7 kg)   LMP 04/09/2016   SpO2 96%   BMI 47.59 kg/m    Subjective:    Patient ID: Jenna Fuller, female    DOB: Mar 13, 1971, 53 y.o.   MRN: [id number]  HPI: Jenna Fuller is a 53 y.o. female presenting on 10/16/2023 for comprehensive medical examination. Current medical complaints include:  OBESITY Duration: Chronic Previous attempts at weight loss: yes, diet, exercise, weight watchers, portion control Complications of obesity: HLD Peak weight: 306 Weight loss goal: to be healthy  Weight loss to date: 20lbs!! Requesting obesity pharmacotherapy: yes Current weight loss supplements/medications: yes Previous weight loss supplements/meds: no  HYPERTENSION / HYPERLIPIDEMIA Satisfied with current treatment? no Duration of hypertension:  off and on BP monitoring frequency: rarely BP medication side effects: N/A Past BP meds: none Duration of hyperlipidemia: chronic Cholesterol medication side effects: no Cholesterol supplements: none Past cholesterol medications: atorvastatin Medication compliance: excellent compliance Aspirin: no Recent stressors: no Recurrent headaches: no Visual changes: no Palpitations: no Dyspnea: no Chest pain: no Lower extremity edema: no Dizzy/lightheaded: no   Menopausal Symptoms: no  Depression Screen done today and results listed below:     10/16/2023   10:22 AM 08/05/2023    8:10 AM 04/11/2023    9:11 AM 03/25/2023   10:43 AM 09/10/2022    8:48 AM  Depression screen PHQ 2/9  Decreased Interest 0 0 0 0 0  Down, Depressed, Hopeless 0 0 0 0 0  PHQ - 2 Score 0 0 0 0 0  Altered sleeping 0 0 1 1 1   Tired, decreased energy 0 0 1 0 0  Change in appetite 0 0 0 0 0  Feeling bad or failure about yourself  0 0 0 0 0  Trouble concentrating 0 0 0 0 0  Moving slowly or fidgety/restless 0 0 0 0 0  Suicidal thoughts 0 0 0 0 0  PHQ-9 Score 0 0 2 1 1   Difficult doing  work/chores  Not difficult at all Not difficult at all Not difficult at all Not difficult at all    Past Medical History:  Past Medical History:  Diagnosis Date   Mammographic microcalcification    Small intestinal bacterial overgrowth (SIBO)     Surgical History:  Past Surgical History:  Procedure Laterality Date   BREAST BIOPSY Left 2013   neg   COLONOSCOPY WITH PROPOFOL N/A 04/26/2020   Procedure: COLONOSCOPY WITH PROPOFOL;  Surgeon: Toney Reil, MD;  Location: [facility] END[facility]COPY;  Service: Gastroenterology;  Laterality: N/A;    Medications:  Current Outpatient Medications on File Prior to Visit  Medication Sig   atorvastatin (LIPITOR) 40 MG tablet Take 1 tablet (40 mg total) by mouth daily.   Coenzyme Q10 (CO Q 10 PO) Take by mouth.   Multiple Vitamins-Minerals (MULTIVITAL PO) Take 1 tablet by mouth daily.   OIL OF OREGANO PO Take by mouth.   Omega-3 Fatty Acids (FISH OIL) 1000 MG CAPS Take 1 capsule by mouth daily.   No current facility-administered medications on file prior to visit.    Allergies:  No Known Allergies  Social History:  Social History   Socioeconomic History   Marital status: Divorced    Spouse name: Not on file   Number of children: Not on file   Years of education: Not on file   Highest education level: Not on file  Occupational  History   Not on file  Tobacco Use   Smoking status: Never   Smokeless tobacco: Never  Vaping Use   Vaping status: Never Used  Substance and Sexual Activity   Alcohol use: No   Drug use: No   Sexual activity: Not Currently  Other Topics Concern   Not on file  Social History Narrative   Not on file   Social Drivers of Health   Financial Resource Strain: Low Risk  (10/16/2023)   Overall Financial Resource Strain (CARDIA)    Difficulty of Paying Living Expenses: Not hard at all  Food Insecurity: No Food Insecurity (10/16/2023)   Hunger Vital Sign    Worried About Running Out of Food in the Last Year:  Never true    Ran Out of Food in the Last Year: Never true  Transportation Needs: No Transportation Needs (10/16/2023)   PRAPARE - Administrator, Civil Service (Medical): No    Lack of Transportation (Non-Medical): No  Physical Activity: Sufficiently Active (10/16/2023)   Exercise Vital Sign    Days of Exercise per Week: 6 days    Minutes of Exercise per Session: 30 min  Stress: No Stress Concern Present (10/16/2023)   [employer] of Occupational Health - Occupational Stress Questionnaire    Feeling of Stress : Not at all  Social Connections: Not on file  Intimate Partner Violence: Not At Risk (10/16/2023)   Humiliation, Afraid, Rape, and Kick questionnaire    Fear of Current or Ex-Partner: No    Emotionally Abused: No    Physically Abused: No    Sexually Abused: No   Social History   Tobacco Use  Smoking Status Never  Smokeless Tobacco Never   Social History   Substance and Sexual Activity  Alcohol Use No    Family History:  Family History  Problem Relation Age of Onset   Breast cancer Mother 61       BRACA NEG   Cancer Mother        breast   Ulcerative colitis Sister    Irritable bowel syndrome Sister    Breast cancer Paternal Aunt 60   Irritable bowel syndrome Paternal Aunt    Irritable bowel syndrome Paternal Uncle    Diabetes Paternal Grandmother    Hypertension Paternal Grandmother     Past medical history, surgical history, medications, allergies, family history and social history reviewed with patient today and changes made to appropriate areas of the chart.   Review of Systems  Constitutional: Negative.   HENT: Negative.    Eyes: Negative.   Respiratory: Negative.    Cardiovascular: Negative.   Gastrointestinal: Negative.   Genitourinary: Negative.   Musculoskeletal: Negative.   Skin: Negative.   Neurological: Negative.   Endo/Heme/Allergies: Negative.   Psychiatric/Behavioral: Negative.     All other ROS negative except what is  listed above and in the HPI.      Objective:    BP (!) 160/90   Pulse 80   Ht 5\' 5"  (1.651 m)   Wt 286 lb (129.7 kg)   LMP 04/09/2016   SpO2 96%   BMI 47.59 kg/m   Wt Readings from Last 3 Encounters:  10/16/23 286 lb (129.7 kg)  09/30/23 296 lb (134.3 kg)  08/05/23 (!) 306 lb 6.4 oz (139 kg)    Physical Exam Vitals and nursing note reviewed.  Constitutional:      General: She is not in acute distress.    Appearance: Normal appearance. She is  obese. She is not ill-appearing, toxic-appearing or diaphoretic.  HENT:     Head: Normocephalic and atraumatic.     Right Ear: Tympanic membrane, ear canal and external ear normal. There is no impacted cerumen.     Left Ear: Tympanic membrane, ear canal and external ear normal. There is no impacted cerumen.     Nose: Nose normal. No congestion or rhinorrhea.     Mouth/Throat:     Mouth: Mucous membranes are moist.     Pharynx: Oropharynx is clear. No oropharyngeal exudate or posterior oropharyngeal erythema.  Eyes:     General: No scleral icterus.       Right eye: No discharge.        Left eye: No discharge.     Extraocular Movements: Extraocular movements intact.     Conjunctiva/sclera: Conjunctivae normal.     Pupils: Pupils are equal, round, and reactive to light.  Neck:     Vascular: No carotid bruit.  Cardiovascular:     Rate and Rhythm: Normal rate and regular rhythm.     Pulses: Normal pulses.     Heart sounds: No murmur heard.    No friction rub. No gallop.  Pulmonary:     Effort: Pulmonary effort is normal. No respiratory distress.     Breath sounds: Normal breath sounds. No stridor. No wheezing, rhonchi or rales.  Chest:     Chest wall: No tenderness.  Abdominal:     General: Abdomen is flat. Bowel sounds are normal. There is no distension.     Palpations: Abdomen is soft. There is no mass.     Tenderness: There is no abdominal tenderness. There is no right CVA tenderness, left CVA tenderness, guarding or rebound.      Hernia: No hernia is present.  Genitourinary:    Comments: Breast and pelvic exams deferred with shared decision making Musculoskeletal:        General: No swelling, tenderness, deformity or signs of injury.     Cervical back: Normal range of motion and neck supple. No rigidity. No muscular tenderness.     Right lower leg: No edema.     Left lower leg: No edema.  Lymphadenopathy:     Cervical: No cervical adenopathy.  Skin:    General: Skin is warm and dry.     Capillary Refill: Capillary refill takes less than 2 seconds.     Coloration: Skin is not jaundiced or pale.     Findings: No bruising, erythema, lesion or rash.  Neurological:     General: No focal deficit present.     Mental Status: She is alert and oriented to person, place, and time. Mental status is at baseline.     Cranial Nerves: No cranial nerve deficit.     Sensory: No sensory deficit.     Motor: No weakness.     Coordination: Coordination normal.     Gait: Gait normal.     Deep Tendon Reflexes: Reflexes normal.  Psychiatric:        Mood and Affect: Mood normal.        Behavior: Behavior normal.        Thought Content: Thought content normal.        Judgment: Judgment normal.     Results for orders placed or performed in visit on 05/28/23  Dexamethasone, blood   Collection Time: 07/15/23  7:11 AM  Result Value Ref Range   Dexamethasone, Blood 400 ng/dL  Cortisol   Collection Time: 07/15/23  7:11 AM  Result Value Ref Range   Cortisol 1.0 (L) 6.2 - 19.4 ug/dL      Assessment & Plan:   Problem List Items Addressed This Visit       Cardiovascular and Mediastinum   Primary hypertension   Will start her on 10mg  lisinopril and recheck in 6 weeks. Call with any concerns.       Relevant Medications   lisinopril (ZESTRIL) 10 MG tablet   Other Relevant Orders   Microalbumin, Urine Waived     Other   Hyperlipidemia   Under good control on current regimen. Continue current regimen. Continue to monitor.  Call with any concerns. Refills given. Labs drawn today.       Relevant Medications   lisinopril (ZESTRIL) 10 MG tablet   Morbid obesity (HCC)   Congratulated patient on 20lb weight loss! She is down 6.5% of her body weight and tolerating her medicine well. Continue current regimen. Continue to monitor. Call with any concerns.       Relevant Medications   Semaglutide-Weight Management (WEGOVY) 2.4 MG/0.75ML SOAJ   Other Visit Diagnoses       Routine general medical examination at a health care facility    -  Primary   Vaccines updated. Screening labs checked today. Pap through GYN and due. Mammo ordered. Colonoscopy up to date. Continue diet and exercise. Call with concerns.   Relevant Orders   Bayer DCA Hb A1c Waived   CBC with Differential/Platelet   Comprehensive metabolic panel with GFR   Lipid Panel w/o Chol/HDL Ratio   TSH     Encounter for screening mammogram for malignant neoplasm of breast       Mammo ordered today.   Relevant Orders   MM 3D SCREENING MAMMOGRAM BILATERAL BREAST        Follow up plan: Return in about 2 months (around 12/16/2023).   LABORATORY TESTING:  - Pap smear: done elsewhere  IMMUNIZATIONS:   - Tdap: Tetanus vaccination status reviewed: last tetanus booster within 10 years. - Influenza: Refused - Pneumovax: Not applicable - Prevnar: Not applicable - COVID: Up to date - HPV: Not applicable - Shingrix vaccine: Administered today  SCREENING: -Mammogram: Ordered today  - Colonoscopy: Up to date   PATIENT COUNSELING:   Advised to take 1 mg of folate supplement per day if capable of pregnancy.   Sexuality: Discussed sexually transmitted diseases, partner selection, use of condoms, avoidance of unintended pregnancy  and contraceptive alternatives.   Advised to avoid cigarette smoking.  I discussed with the patient that most people either abstain from alcohol or drink within safe limits (<=14/week and <=4 drinks/occasion for males,  <=7/weeks and <= 3 drinks/occasion for females) and that the risk for alcohol disorders and other health effects rises proportionally with the number of drinks per week and how often a drinker exceeds daily limits.  Discussed cessation/primary prevention of drug use and availability of treatment for abuse.   Diet: Encouraged to adjust caloric intake to maintain  or achieve ideal body weight, to reduce intake of dietary saturated fat and total fat, to limit sodium intake by avoiding high sodium foods and not adding table salt, and to maintain adequate dietary potassium and calcium preferably from fresh fruits, vegetables, and low-fat dairy products.    stressed the importance of regular exercise  Injury prevention: Discussed safety belts, safety helmets, smoke detector, smoking near bedding or upholstery.   Dental health: Discussed importance of regular tooth brushing, flossing, and dental visits.  NEXT PREVENTATIVE PHYSICAL DUE IN 1 YEAR. Return in about 2 months (around 12/16/2023).

## 2023-10-16 NOTE — Assessment & Plan Note (Signed)
 Under good control on current regimen. Continue current regimen. Continue to monitor. Call with any concerns. Refills given. Labs drawn today.

## 2023-10-16 NOTE — Patient Instructions (Signed)
 Please call to schedule your mammogram and/or bone density: [facility]  Address: [address], [place], [place] [address] Phone: [phone]  [facility] [address] [place],  [place]  [address] Phone: [phone]

## 2023-10-16 NOTE — Assessment & Plan Note (Signed)
 Congratulated patient on 20lb weight loss! She is down 6.5% of her body weight and tolerating her medicine well. Continue current regimen. Continue to monitor. Call with any concerns.

## 2023-10-17 LAB — LIPID PANEL W/O CHOL/HDL RATIO
Cholesterol, Total: 159 mg/dL (ref 100–199)
HDL: 51 mg/dL (ref >39)
LDL Chol Calc (NIH): 93 mg/dL (ref 0–99)
Triglycerides: 81 mg/dL (ref 0–149)
VLDL Cholesterol Cal: 15 mg/dL (ref 5–40)

## 2023-10-17 LAB — COMPREHENSIVE METABOLIC PANEL WITH GFR
ALT: 19 IU/L (ref 0–32)
AST: 24 IU/L (ref 0–40)
Albumin: 4.5 g/dL (ref 3.8–4.9)
Alkaline Phosphatase: 77 IU/L (ref 44–121)
BUN/Creatinine Ratio: 13 (ref 9–23)
BUN: 14 mg/dL (ref 6–24)
Bilirubin Total: 0.8 mg/dL (ref 0.0–1.2)
CO2: 20 mmol/L (ref 20–29)
Calcium: 9.7 mg/dL (ref 8.7–10.2)
Chloride: 100 mmol/L (ref 96–106)
Creatinine, Ser: 1.06 mg/dL — ABNORMAL HIGH (ref 0.57–1.00)
Globulin, Total: 3 g/dL (ref 1.5–4.5)
Glucose: 73 mg/dL (ref 70–99)
Potassium: 4.3 mmol/L (ref 3.5–5.2)
Sodium: 140 mmol/L (ref 134–144)
Total Protein: 7.5 g/dL (ref 6.0–8.5)
eGFR: 64 mL/min/1.73

## 2023-10-17 LAB — CBC WITH DIFFERENTIAL/PLATELET
Basophils Absolute: 0 10*3/uL (ref 0.0–0.2)
Basos: 1 %
EOS (ABSOLUTE): 0.2 10*3/uL (ref 0.0–0.4)
Eos: 3 %
Hematocrit: 43.3 % (ref 34.0–46.6)
Hemoglobin: 14.1 g/dL (ref 11.1–15.9)
Immature Grans (Abs): 0 10*3/uL (ref 0.0–0.1)
Immature Granulocytes: 0 %
Lymphocytes Absolute: 1.8 10*3/uL (ref 0.7–3.1)
Lymphs: 29 %
MCH: 26.7 pg (ref 26.6–33.0)
MCHC: 32.6 g/dL (ref 31.5–35.7)
MCV: 82 fL (ref 79–97)
Monocytes Absolute: 0.5 10*3/uL (ref 0.1–0.9)
Monocytes: 8 %
Neutrophils Absolute: 3.6 10*3/uL (ref 1.4–7.0)
Neutrophils: 59 %
Platelets: 293 10*3/uL (ref 150–450)
RBC: 5.28 x10E6/uL (ref 3.77–5.28)
RDW: 12.6 % (ref 11.7–15.4)
WBC: 6.2 10*3/uL (ref 3.4–10.8)

## 2023-10-17 LAB — TSH: TSH: 1.06 u[IU]/mL (ref 0.450–4.500)

## 2023-10-21 ENCOUNTER — Encounter: Payer: Self-pay | Admitting: Family Medicine

## 2023-10-21 ENCOUNTER — Ambulatory Visit

## 2023-10-30 ENCOUNTER — Other Ambulatory Visit: Payer: Self-pay

## 2023-11-05 ENCOUNTER — Other Ambulatory Visit: Payer: Self-pay | Admitting: Family Medicine

## 2023-11-05 NOTE — Telephone Encounter (Signed)
 Copied from CRM [id number]. Topic: Clinical - Medication Refill >> Nov 05, 2023  9:14 AM Winnifred Havers wrote: Most Recent Primary Care Visit:  Provider: Terre Ferri P  Department: [facility]  Visit Type: OFFICE VISIT  Date: 10/16/2023  Medication: lisinopril  (ZESTRIL ) 10 MG tablet  100 day supply  Has the patient contacted their pharmacy? No (Agent: If no, request that the patient contact the pharmacy for the refill. If patient does not wish to contact the pharmacy document the reason why and proceed with request.) (Agent: If yes, when and what did the pharmacy advise?)  Is this the correct pharmacy for this prescription? No If no, delete pharmacy and type the correct one.  This is the patient's preferred pharmacy:    [facility] [place], [place] - [address] [place] [address] Phone: [phone] Fax: [phone]   Has the prescription been filled recently? Yes  Is the patient out of the medication? Yes  Has the patient been seen for an appointment in the last year OR does the patient have an upcoming appointment? Yes  Can we respond through MyChart? Yes  Agent: Please be advised that Rx refills may take up to 3 business days. We ask that you follow-up with your pharmacy.

## 2023-11-08 NOTE — Telephone Encounter (Signed)
 Request too soon, last ordered 10/16/23, #90, 0 refills Requested Prescriptions  Pending Prescriptions Disp Refills   lisinopril  (ZESTRIL ) 10 MG tablet 90 tablet 0    Sig: Take 1 tablet (10 mg total) by mouth daily.     Cardiovascular:  ACE Inhibitors Failed - 11/08/2023  2:24 AM      Failed - Cr in normal range and within 180 days    Creatinine, Ser  Date Value Ref Range Status  10/16/2023 1.06 (H) 0.57 - 1.00 mg/dL Final         Failed - Last BP in normal range    BP Readings from Last 1 Encounters:  10/16/23 (!) 160/90         Passed - K in normal range and within 180 days    Potassium  Date Value Ref Range Status  10/16/2023 4.3 3.5 - 5.2 mmol/L Final         Passed - Patient is not pregnant      Passed - Valid encounter within last 6 months    Recent Outpatient Visits           3 weeks ago Routine general medical examination at a health care facility   [facility], Megan P, DO   1 month ago Morbid obesity [facility])   [place] [facility] [place], [place], DO

## 2023-11-14 ENCOUNTER — Ambulatory Visit
Admission: RE | Admit: 2023-11-14 | Discharge: 2023-11-14 | Disposition: A | Discharge status: Home / Self Care | Source: Ambulatory Visit | Attending: Family Medicine | Admitting: Family Medicine

## 2023-11-14 DIAGNOSIS — Z1231 Encounter for screening mammogram for malignant neoplasm of breast: Secondary | ICD-10-CM | POA: Insufficient documentation

## 2023-11-18 ENCOUNTER — Ambulatory Visit: Payer: Self-pay | Admitting: Family Medicine

## 2023-11-24 ENCOUNTER — Other Ambulatory Visit: Payer: Self-pay

## 2023-11-24 MED ORDER — LISINOPRIL 10 MG PO TABS: 10.0000 mg | ORAL_TABLET | Freq: Every day | ORAL | 0 refills | Status: DC

## 2023-12-16 ENCOUNTER — Ambulatory Visit: Admitting: Family Medicine

## 2023-12-16 ENCOUNTER — Encounter: Payer: Self-pay | Admitting: Family Medicine

## 2023-12-16 VITALS — BP 123/80 | HR 76 | Ht 65.0 in | Wt 278.0 lb

## 2023-12-16 DIAGNOSIS — I10 Essential (primary) hypertension: Secondary | ICD-10-CM

## 2023-12-16 MED ORDER — LISINOPRIL 10 MG PO TABS: 10.0000 mg | ORAL_TABLET | Freq: Every day | ORAL | 1 refills | Status: DC

## 2023-12-16 NOTE — Progress Notes (Signed)
 BP 123/80 (BP Location: Left Arm, Patient Position: Sitting, Cuff Size: Large)   Pulse 76   Ht 5\' 5"  (1.651 m)   Wt 278 lb (126.1 kg)   LMP 04/09/2016   SpO2 96%   BMI 46.26 kg/m    Subjective:    Patient ID: Jenna Fuller, female    DOB: Dec 04, 1970, 53 y.o.   MRN: [id number]  HPI: Jenna Fuller is a 53 y.o. female  Chief Complaint  Patient presents with   Hypertension   HYPERTENSION  Hypertension status: controlled  Satisfied with current treatment? yes Duration of hypertension: months BP monitoring frequency:  rarely BP medication side effects:  no Medication compliance: excellent compliance Previous BP meds: lisinopril  Aspirin: no Recurrent headaches: no Visual changes: no Palpitations: no Dyspnea: no Chest pain: no Lower extremity edema: no Dizzy/lightheaded: no  OBESITY Duration: Chronic Previous attempts at weight loss: yes, diet, exercise, weight watchers, portion control Complications of obesity: HLD Peak weight: 306 Weight loss goal: to be healthy  Weight loss to date: 28lbs!! Requesting obesity pharmacotherapy: yes Current weight loss supplements/medications: yes Previous weight loss supplements/meds: no   Relevant past medical, surgical, family and social history reviewed and updated as indicated. Interim medical history since our last visit reviewed. Allergies and medications reviewed and updated.  Review of Systems  Constitutional: Negative.   Respiratory: Negative.    Cardiovascular: Negative.   Musculoskeletal: Negative.   Neurological: Negative.   Psychiatric/Behavioral: Negative.      Per HPI unless specifically indicated above     Objective:     BP 123/80 (BP Location: Left Arm, Patient Position: Sitting, Cuff Size: Large)   Pulse 76   Ht 5\' 5"  (1.651 m)   Wt 278 lb (126.1 kg)   LMP 04/09/2016   SpO2 96%   BMI 46.26 kg/m   Wt Readings from Last 3 Encounters:  12/16/23 278 lb (126.1 kg)  10/16/23 286  lb (129.7 kg)  09/30/23 296 lb (134.3 kg)    Physical Exam Vitals and nursing note reviewed.  Constitutional:      General: She is not in acute distress.    Appearance: Normal appearance. She is not ill-appearing, toxic-appearing or diaphoretic.  HENT:     Head: Normocephalic and atraumatic.     Right Ear: External ear normal.     Left Ear: External ear normal.     Nose: Nose normal.     Mouth/Throat:     Mouth: Mucous membranes are moist.     Pharynx: Oropharynx is clear.  Eyes:     General: No scleral icterus.       Right eye: No discharge.        Left eye: No discharge.     Extraocular Movements: Extraocular movements intact.     Conjunctiva/sclera: Conjunctivae normal.     Pupils: Pupils are equal, round, and reactive to light.  Cardiovascular:     Rate and Rhythm: Normal rate and regular rhythm.     Pulses: Normal pulses.     Heart sounds: Normal heart sounds. No murmur heard.    No friction rub. No gallop.  Pulmonary:     Effort: Pulmonary effort is normal. No respiratory distress.     Breath sounds: Normal breath sounds. No stridor. No wheezing, rhonchi or rales.  Chest:     Chest wall: No tenderness.  Musculoskeletal:        General: Normal range of motion.     Cervical back: Normal range of motion  and neck supple.  Skin:    General: Skin is warm and dry.     Capillary Refill: Capillary refill takes less than 2 seconds.     Coloration: Skin is not jaundiced or pale.     Findings: No bruising, erythema, lesion or rash.  Neurological:     General: No focal deficit present.     Mental Status: She is alert and oriented to person, place, and time. Mental status is at baseline.  Psychiatric:        Mood and Affect: Mood normal.        Behavior: Behavior normal.        Thought Content: Thought content normal.        Judgment: Judgment normal.     Results for orders placed or performed in visit on 10/16/23  Bayer DCA Hb A1c Waived   Collection Time: 10/16/23  10:50 AM  Result Value Ref Range   HB A1C (BAYER DCA - WAIVED) 5.8 (H) 4.8 - 5.6 %  Microalbumin, Urine Waived   Collection Time: 10/16/23 10:50 AM  Result Value Ref Range   Microalb, Ur Waived 80 (H) 0 - 19 mg/L   Creatinine, Urine Waived 300 10 - 300 mg/dL   Microalb/Creat Ratio 30-300 (H) <30 mg/g  CBC with Differential/Platelet   Collection Time: 10/16/23 10:52 AM  Result Value Ref Range   WBC 6.2 3.4 - 10.8 x10E3/uL   RBC 5.28 3.77 - 5.28 x10E6/uL   Hemoglobin 14.1 11.1 - 15.9 g/dL   Hematocrit 40.9 81.1 - 46.6 %   MCV 82 79 - 97 fL   MCH 26.7 26.6 - 33.0 pg   MCHC 32.6 31.5 - 35.7 g/dL   RDW 91.4 78.2 - 95.6 %   Platelets 293 150 - 450 x10E3/uL   Neutrophils 59 Not Estab. %   Lymphs 29 Not Estab. %   Monocytes 8 Not Estab. %   Eos 3 Not Estab. %   Basos 1 Not Estab. %   Neutrophils Absolute 3.6 1.4 - 7.0 x10E3/uL   Lymphocytes Absolute 1.8 0.7 - 3.1 x10E3/uL   Monocytes Absolute 0.5 0.1 - 0.9 x10E3/uL   EOS (ABSOLUTE) 0.2 0.0 - 0.4 x10E3/uL   Basophils Absolute 0.0 0.0 - 0.2 x10E3/uL   Immature Granulocytes 0 Not Estab. %   Immature Grans (Abs) 0.0 0.0 - 0.1 x10E3/uL  Comprehensive metabolic panel with GFR   Collection Time: 10/16/23 10:52 AM  Result Value Ref Range   Glucose 73 70 - 99 mg/dL   BUN 14 6 - 24 mg/dL   Creatinine, Ser 2.13 (H) 0.57 - 1.00 mg/dL   eGFR 64 >08 MV/HQI/6.96   BUN/Creatinine Ratio 13 9 - 23   Sodium 140 134 - 144 mmol/L   Potassium 4.3 3.5 - 5.2 mmol/L   Chloride 100 96 - 106 mmol/L   CO2 20 20 - 29 mmol/L   Calcium  9.7 8.7 - 10.2 mg/dL   Total Protein 7.5 6.0 - 8.5 g/dL   Albumin 4.5 3.8 - 4.9 g/dL   Globulin, Total 3.0 1.5 - 4.5 g/dL   Bilirubin Total 0.8 0.0 - 1.2 mg/dL   Alkaline Phosphatase 77 44 - 121 IU/L   AST 24 0 - 40 IU/L   ALT 19 0 - 32 IU/L  Lipid Panel w/o Chol/HDL Ratio   Collection Time: 10/16/23 10:52 AM  Result Value Ref Range   Cholesterol, Total 159 100 - 199 mg/dL   Triglycerides 81 0 - 149 mg/dL  HDL 51  >39 mg/dL   VLDL Cholesterol Cal 15 5 - 40 mg/dL   LDL Chol Calc (NIH) 93 0 - 99 mg/dL  TSH   Collection Time: 10/16/23 10:52 AM  Result Value Ref Range   TSH 1.060 0.450 - 4.500 uIU/mL      Assessment & Plan:   Problem List Items Addressed This Visit       Cardiovascular and Mediastinum   Primary hypertension - Primary   Under good control on current regimen. Continue current regimen. Continue to monitor. Call with any concerns. Refills given. Labs drawn today.        Relevant Medications   lisinopril  (ZESTRIL ) 10 MG tablet   Other Relevant Orders   Basic metabolic panel with GFR     Other   Morbid obesity (HCC)   Congratulated patient on a further 8lb weight loss! Down 28lbs total! Continue wegovy . Continue diet and exercise. Call with any concerns.         Follow up plan: Return in about 4 months (around 04/16/2024).

## 2023-12-16 NOTE — Assessment & Plan Note (Signed)
 Under good control on current regimen. Continue current regimen. Continue to monitor. Call with any concerns. Refills given. Labs drawn today.

## 2023-12-16 NOTE — Assessment & Plan Note (Signed)
 Congratulated patient on a further 8lb weight loss! Down 28lbs total! Continue wegovy . Continue diet and exercise. Call with any concerns.

## 2023-12-17 ENCOUNTER — Ambulatory Visit: Payer: Self-pay | Admitting: Family Medicine

## 2023-12-17 LAB — BASIC METABOLIC PANEL WITH GFR
BUN/Creatinine Ratio: 11 (ref 9–23)
BUN: 11 mg/dL (ref 6–24)
CO2: 20 mmol/L (ref 20–29)
Calcium: 9.6 mg/dL (ref 8.7–10.2)
Chloride: 101 mmol/L (ref 96–106)
Creatinine, Ser: 1 mg/dL (ref 0.57–1.00)
Glucose: 78 mg/dL (ref 70–99)
Potassium: 4.4 mmol/L (ref 3.5–5.2)
Sodium: 138 mmol/L (ref 134–144)
eGFR: 68 mL/min/{1.73_m2} (ref >59)

## 2023-12-22 ENCOUNTER — Other Ambulatory Visit: Payer: Self-pay | Admitting: Family Medicine

## 2023-12-25 NOTE — Telephone Encounter (Signed)
 Requested Prescriptions  Pending Prescriptions Disp Refills   atorvastatin  (LIPITOR) 40 MG tablet [Pharmacy Med Name: Atorvastatin  Calcium  40 MG Oral Tablet] 90 tablet 3    Sig: TAKE 1 TABLET BY MOUTH DAILY     Cardiovascular:  Antilipid - Statins Failed - 12/25/2023  9:39 AM      Failed - Lipid Panel in normal range within the last 12 months    Cholesterol, Total  Date Value Ref Range Status  10/16/2023 159 100 - 199 mg/dL Final   LDL Chol Calc (NIH)  Date Value Ref Range Status  10/16/2023 93 0 - 99 mg/dL Final   HDL  Date Value Ref Range Status  10/16/2023 51 >39 mg/dL Final   Triglycerides  Date Value Ref Range Status  10/16/2023 81 0 - 149 mg/dL Final         Passed - Patient is not pregnant      Passed - Valid encounter within last 12 months    Recent Outpatient Visits           1 week ago Primary hypertension   [place] [facility] [place], Megan P, DO   2 months ago Routine general medical examination at a health care facility   [facility] [place] P, DO   2 months ago Morbid obesity [facility])   [place] [facility] [place], [place], DO

## 2024-01-02 ENCOUNTER — Other Ambulatory Visit

## 2024-04-16 ENCOUNTER — Ambulatory Visit: Admitting: Family Medicine

## 2024-04-16 ENCOUNTER — Encounter: Payer: Self-pay | Admitting: Family Medicine

## 2024-04-16 VITALS — BP 127/70 | HR 80 | Temp 98.1°F | Resp 15 | Ht 65.0 in | Wt 264.8 lb

## 2024-04-16 DIAGNOSIS — R7301 Impaired fasting glucose: Secondary | ICD-10-CM | POA: Diagnosis not present

## 2024-04-16 DIAGNOSIS — E785 Hyperlipidemia, unspecified: Secondary | ICD-10-CM

## 2024-04-16 DIAGNOSIS — I10 Essential (primary) hypertension: Secondary | ICD-10-CM

## 2024-04-16 DIAGNOSIS — Z23 Encounter for immunization: Secondary | ICD-10-CM | POA: Diagnosis not present

## 2024-04-16 DIAGNOSIS — Z789 Other specified health status: Secondary | ICD-10-CM

## 2024-04-16 LAB — BAYER DCA HB A1C WAIVED: HB A1C (BAYER DCA - WAIVED): 5.5 % (ref 4.8–5.6)

## 2024-04-16 MED ORDER — WEGOVY 2.4 MG/0.75ML ~~LOC~~ SOAJ: 2.4000 mg | SUBCUTANEOUS | 0 refills | Status: DC

## 2024-04-16 MED ORDER — TIRZEPATIDE-WEIGHT MANAGEMENT 10 MG/0.5ML ~~LOC~~ SOLN: 10.0000 mg | SUBCUTANEOUS | 2 refills | Status: DC

## 2024-04-16 MED ORDER — LISINOPRIL 10 MG PO TABS: 10.0000 mg | ORAL_TABLET | Freq: Every day | ORAL | 1 refills | Status: DC

## 2024-04-16 NOTE — Assessment & Plan Note (Signed)
 Under good control on current regimen. Continue current regimen. Continue to monitor. Call with any concerns. Refills given. Labs drawn today.

## 2024-04-16 NOTE — Assessment & Plan Note (Signed)
 Congratulated patient on 42lb weight loss! Doing great but starting to plateau. Would like to change to zepbound do to decreased efficacy of wegovy . Will send in and recheck in 3 months.

## 2024-04-16 NOTE — Progress Notes (Signed)
 BP 127/70 (BP Location: Left Arm, Patient Position: Sitting, Cuff Size: Large)   Pulse 80   Temp 98.1 F (36.7 C) (Oral)   Resp 15   Ht 5' 5 (1.651 m)   Wt 264 lb 12.8 oz (120.1 kg)   LMP 04/09/2016   SpO2 97%   BMI 44.07 kg/m    Subjective:    Patient ID: Jenna Fuller, female    DOB: [birth year], 53 y.o.   MRN: [id number]  HPI: Jenna Fuller is a 53 y.o. female  Chief Complaint  Patient presents with   Obesity    Feels she be losing more. Doing weight watches and is doing some type of physical activity daily.    Hypertension    Some home checks. Doing ok    OBESITY Duration: Chronic Previous attempts at weight loss: yes, diet, exercise, weight watchers, portion control Complications of obesity: HLD Peak weight: 306 Weight loss goal: to be healthy  Weight loss to date: 42lbs!! Requesting obesity pharmacotherapy: yes Current weight loss supplements/medications: yes Previous weight loss supplements/meds: no  HYPERTENSION / HYPERLIPIDEMIA Satisfied with current treatment? yes Duration of hypertension: chronic BP monitoring frequency: rarely BP medication side effects: no Past BP meds: lisinopril  Duration of hyperlipidemia: chronic Cholesterol medication side effects: no Cholesterol supplements: none Past cholesterol medications: atorvastatin  Medication compliance: excellent compliance Aspirin: no Recent stressors: no Recurrent headaches: no Visual changes: no Palpitations: no Dyspnea: no Chest pain: no Lower extremity edema: no Dizzy/lightheaded: no   Relevant past medical, surgical, family and social history reviewed and updated as indicated. Interim medical history since our last visit reviewed. Allergies and medications reviewed and updated.  Review of Systems  Constitutional: Negative.   Respiratory: Negative.    Cardiovascular: Negative.   Musculoskeletal: Negative.   Neurological: Negative.   Psychiatric/Behavioral:  Negative.      Per HPI unless specifically indicated above     Objective:    BP 127/70 (BP Location: Left Arm, Patient Position: Sitting, Cuff Size: Large)   Pulse 80   Temp 98.1 F (36.7 C) (Oral)   Resp 15   Ht 5' 5 (1.651 m)   Wt 264 lb 12.8 oz (120.1 kg)   LMP 04/09/2016   SpO2 97%   BMI 44.07 kg/m   Wt Readings from Last 3 Encounters:  04/16/24 264 lb 12.8 oz (120.1 kg)  12/16/23 278 lb (126.1 kg)  10/16/23 286 lb (129.7 kg)    Physical Exam Vitals and nursing note reviewed.  Constitutional:      General: She is not in acute distress.    Appearance: Normal appearance. She is obese. She is not ill-appearing, toxic-appearing or diaphoretic.  HENT:     Head: Normocephalic and atraumatic.     Right Ear: External ear normal.     Left Ear: External ear normal.     Nose: Nose normal.     Mouth/Throat:     Mouth: Mucous membranes are moist.     Pharynx: Oropharynx is clear.  Eyes:     General: No scleral icterus.       Right eye: No discharge.        Left eye: No discharge.     Extraocular Movements: Extraocular movements intact.     Conjunctiva/sclera: Conjunctivae normal.     Pupils: Pupils are equal, round, and reactive to light.  Cardiovascular:     Rate and Rhythm: Normal rate and regular rhythm.     Pulses: Normal pulses.     Heart  sounds: Normal heart sounds. No murmur heard.    No friction rub. No gallop.  Pulmonary:     Effort: Pulmonary effort is normal. No respiratory distress.     Breath sounds: Normal breath sounds. No stridor. No wheezing, rhonchi or rales.  Chest:     Chest wall: No tenderness.  Musculoskeletal:        General: Normal range of motion.     Cervical back: Normal range of motion and neck supple.  Skin:    General: Skin is warm and dry.     Capillary Refill: Capillary refill takes less than 2 seconds.     Coloration: Skin is not jaundiced or pale.     Findings: No bruising, erythema, lesion or rash.  Neurological:     General:  No focal deficit present.     Mental Status: She is alert and oriented to person, place, and time. Mental status is at baseline.  Psychiatric:        Mood and Affect: Mood normal.        Behavior: Behavior normal.        Thought Content: Thought content normal.        Judgment: Judgment normal.     Results for orders placed or performed in visit on 12/16/23  Basic metabolic panel with GFR   Collection Time: 12/16/23 10:06 AM  Result Value Ref Range   Glucose 78 70 - 99 mg/dL   BUN 11 6 - 24 mg/dL   Creatinine, Ser 8.99 0.57 - 1.00 mg/dL   eGFR 68 >40 fO/fpw/8.26   BUN/Creatinine Ratio 11 9 - 23   Sodium 138 134 - 144 mmol/L   Potassium 4.4 3.5 - 5.2 mmol/L   Chloride 101 96 - 106 mmol/L   CO2 20 20 - 29 mmol/L   Calcium  9.6 8.7 - 10.2 mg/dL      Assessment & Plan:   Problem List Items Addressed This Visit       Cardiovascular and Mediastinum   Primary hypertension - Primary   Under good control on current regimen. Continue current regimen. Continue to monitor. Call with any concerns. Refills given. Labs drawn today.       Relevant Medications   lisinopril  (ZESTRIL ) 10 MG tablet   Other Relevant Orders   CBC with Differential/Platelet   Comprehensive metabolic panel with GFR     Endocrine   IFG (impaired fasting glucose)   Labs drawn today. Await results. Treat as needed.       Relevant Orders   Bayer DCA Hb A1c Waived   CBC with Differential/Platelet   Comprehensive metabolic panel with GFR     Other   Hyperlipidemia   Under good control on current regimen. Continue current regimen. Continue to monitor. Call with any concerns. Refills given. Labs drawn today.        Relevant Medications   lisinopril  (ZESTRIL ) 10 MG tablet   Other Relevant Orders   CBC with Differential/Platelet   Comprehensive metabolic panel with GFR   Lipid Panel w/o Chol/HDL Ratio   Morbid obesity (HCC)   Congratulated patient on 42lb weight loss! Doing great but starting to  plateau. Would like to change to zepbound do to decreased efficacy of wegovy . Will send in and recheck in 3 months.       Relevant Medications   semaglutide -weight management (WEGOVY ) 2.4 MG/0.75ML SOAJ SQ injection   tirzepatide 10 MG/0.5ML injection vial   Other Relevant Orders   CBC with Differential/Platelet  Comprehensive metabolic panel with GFR   Other Visit Diagnoses       Hepatitis B vaccination status unknown       Labs drawn today.   Relevant Orders   CBC with Differential/Platelet   Comprehensive metabolic panel with GFR   Hepatitis B surface antibody,quantitative     Need for COVID-19 vaccine       COVID shot given today.   Relevant Orders   Pfizer Comirnaty Covid -19 Vaccine 88yrs and older        Follow up plan: Return in about 3 months (around 07/17/2024).

## 2024-04-16 NOTE — Assessment & Plan Note (Signed)
 Labs drawn today. Await results. Treat as needed.

## 2024-04-17 LAB — HEPATITIS B SURFACE ANTIBODY, QUANTITATIVE: Hepatitis B Surf Ab Quant: <3.5 m[IU]/mL — ABNORMAL LOW

## 2024-04-17 LAB — COMPREHENSIVE METABOLIC PANEL WITH GFR
ALT: 14 IU/L (ref 0–32)
AST: 21 IU/L (ref 0–40)
Albumin: 4.5 g/dL (ref 3.8–4.9)
Alkaline Phosphatase: 89 IU/L (ref 49–135)
BUN/Creatinine Ratio: 14 (ref 9–23)
BUN: 15 mg/dL (ref 6–24)
Bilirubin Total: 0.5 mg/dL (ref 0.0–1.2)
CO2: 24 mmol/L (ref 20–29)
Calcium: 9.6 mg/dL (ref 8.7–10.2)
Chloride: 102 mmol/L (ref 96–106)
Creatinine, Ser: 1.05 mg/dL — ABNORMAL HIGH (ref 0.57–1.00)
Globulin, Total: 2.6 g/dL (ref 1.5–4.5)
Glucose: 77 mg/dL (ref 70–99)
Potassium: 4.5 mmol/L (ref 3.5–5.2)
Sodium: 140 mmol/L (ref 134–144)
Total Protein: 7.1 g/dL (ref 6.0–8.5)
eGFR: 64 mL/min/1.73 (ref >59)

## 2024-04-17 LAB — CBC WITH DIFFERENTIAL/PLATELET
Basophils Absolute: 0 x10E3/uL (ref 0.0–0.2)
Basos: 1 %
EOS (ABSOLUTE): 0.3 x10E3/uL (ref 0.0–0.4)
Eos: 5 %
Hematocrit: 43 % (ref 34.0–46.6)
Hemoglobin: 13.5 g/dL (ref 11.1–15.9)
Immature Grans (Abs): 0 x10E3/uL (ref 0.0–0.1)
Immature Granulocytes: 0 %
Lymphocytes Absolute: 1.9 x10E3/uL (ref 0.7–3.1)
Lymphs: 34 %
MCH: 26.6 pg (ref 26.6–33.0)
MCHC: 31.4 g/dL — ABNORMAL LOW (ref 31.5–35.7)
MCV: 85 fL (ref 79–97)
Monocytes Absolute: 0.4 x10E3/uL (ref 0.1–0.9)
Monocytes: 6 %
Neutrophils Absolute: 3 x10E3/uL (ref 1.4–7.0)
Neutrophils: 54 %
Platelets: 290 x10E3/uL (ref 150–450)
RBC: 5.07 x10E6/uL (ref 3.77–5.28)
RDW: 12.7 % (ref 11.7–15.4)
WBC: 5.6 x10E3/uL (ref 3.4–10.8)

## 2024-04-17 LAB — LIPID PANEL W/O CHOL/HDL RATIO
Cholesterol, Total: 146 mg/dL (ref 100–199)
HDL: 57 mg/dL (ref >39)
LDL Chol Calc (NIH): 76 mg/dL (ref 0–99)
Triglycerides: 63 mg/dL (ref 0–149)
VLDL Cholesterol Cal: 13 mg/dL (ref 5–40)

## 2024-04-23 ENCOUNTER — Ambulatory Visit: Payer: Self-pay | Admitting: Family Medicine

## 2024-04-26 ENCOUNTER — Other Ambulatory Visit (HOSPITAL_COMMUNITY): Payer: Self-pay

## 2024-04-26 ENCOUNTER — Telehealth: Payer: Self-pay | Admitting: Pharmacy Technician

## 2024-04-26 NOTE — Telephone Encounter (Signed)
 Pharmacy Patient Advocate Encounter   Received notification from Onbase that prior authorization for Wegovy  2.4MG /0.75ML auto-injectors is due for renewal.   Insurance verification completed.   The patient is insured through [facility].  Action: PA required; PA submitted to above mentioned insurance via CoverMyMeds Key/confirmation #/EOC [id number] Status is pending

## 2024-04-26 NOTE — Telephone Encounter (Signed)
 Pharmacy Patient Advocate Encounter  Received notification from [employer] that Prior Authorization for Wegovy  2.4MG /0.75ML auto-injectors has been APPROVED from 04/26/24 to 04/26/25. Unable to obtain price due to refill too soon rejection, last fill date 04/16/24 next available fill date10/31/25   PA #/Case ID/Reference #: [id number]

## 2024-05-03 ENCOUNTER — Encounter: Payer: Self-pay | Admitting: Family Medicine

## 2024-05-03 ENCOUNTER — Other Ambulatory Visit: Payer: Self-pay | Admitting: Family Medicine

## 2024-05-04 NOTE — Telephone Encounter (Signed)
 Too soon for refill, LRF 04/16/24 FOR 90 AND 1 RF.  Requested Prescriptions  Pending Prescriptions Disp Refills   lisinopril  (ZESTRIL ) 10 MG tablet [Pharmacy Med Name: Lisinopril  10 MG Oral Tablet] 90 tablet 3    Sig: TAKE 1 TABLET BY MOUTH DAILY     Cardiovascular:  ACE Inhibitors Failed - 05/04/2024  1:45 PM      Failed - Cr in normal range and within 180 days    Creatinine, Ser  Date Value Ref Range Status  04/16/2024 1.05 (H) 0.57 - 1.00 mg/dL Final         Passed - K in normal range and within 180 days    Potassium  Date Value Ref Range Status  04/16/2024 4.5 3.5 - 5.2 mmol/L Final         Passed - Patient is not pregnant      Passed - Last BP in normal range    BP Readings from Last 1 Encounters:  04/16/24 127/70         Passed - Valid encounter within last 6 months    Recent Outpatient Visits           2 weeks ago Primary hypertension   [place] [facility] [place], [place], DO   4 months ago Primary hypertension   [place] [facility] [place], Megan P, DO   6 months ago Routine general medical examination at a health care facility   [facility], [place], DO   7 months ago Morbid obesity [facility])   [place] [facility] [place], [place], DO

## 2024-05-18 ENCOUNTER — Other Ambulatory Visit: Payer: Self-pay | Admitting: Family Medicine

## 2024-05-20 NOTE — Telephone Encounter (Signed)
 Mail order pharmacy. Requested Prescriptions  Pending Prescriptions Disp Refills   lisinopril  (ZESTRIL ) 10 MG tablet [Pharmacy Med Name: Lisinopril  10 MG Oral Tablet] 90 tablet 3    Sig: TAKE 1 TABLET BY MOUTH DAILY     Cardiovascular:  ACE Inhibitors Failed - 05/20/2024 12:24 PM      Failed - Cr in normal range and within 180 days    Creatinine, Ser  Date Value Ref Range Status  04/16/2024 1.05 (H) 0.57 - 1.00 mg/dL Final         Passed - K in normal range and within 180 days    Potassium  Date Value Ref Range Status  04/16/2024 4.5 3.5 - 5.2 mmol/L Final         Passed - Patient is not pregnant      Passed - Last BP in normal range    BP Readings from Last 1 Encounters:  04/16/24 127/70         Passed - Valid encounter within last 6 months    Recent Outpatient Visits           1 month ago Primary hypertension   [place] [facility] [place], [place], DO   5 months ago Primary hypertension   [place] [facility] [place], [place] P, DO   7 months ago Routine general medical examination at a health care facility   [facility], [place], DO   7 months ago Morbid obesity [facility])   [place] [facility] [place], [place], DO

## 2024-07-19 ENCOUNTER — Ambulatory Visit: Payer: Self-pay | Admitting: Family Medicine

## 2024-07-19 ENCOUNTER — Encounter: Payer: Self-pay | Admitting: Family Medicine

## 2024-07-19 MED ORDER — ZEPBOUND 12.5 MG/0.5ML ~~LOC~~ SOAJ: 12.5000 mg | SUBCUTANEOUS | 3 refills | Status: AC

## 2024-07-19 NOTE — Progress Notes (Signed)
 "  BP 120/77   Pulse 76   Temp (!) 97.3 F (36.3 C) (Oral)   Ht 5' 5 (1.651 m)   Wt 256 lb 3.2 oz (116.2 kg)   LMP 04/09/2016   SpO2 97%   BMI 42.63 kg/m    Subjective:    Patient ID: Jenna Fuller, female    DOB: [birth year], 54 y.o.   MRN: [id number]  HPI: Jenna Fuller is a 54 y.o. female  Chief Complaint  Patient presents with   Obesity    Would like to discuss increasing the dose on Mounjaro     OBESITY Duration: Chronic Previous attempts at weight loss: yes, diet, exercise, weight watchers, portion control, lumen breath test Complications of obesity: HLD, IFG, HTN Peak weight: 306 Weight loss goal: to be healthy  Weight loss to date: 50lbs!! Requesting obesity pharmacotherapy: yes Current weight loss supplements/medications: yes Previous weight loss supplements/meds: no  Relevant past medical, surgical, family and social history reviewed and updated as indicated. Interim medical history since our last visit reviewed. Allergies and medications reviewed and updated.  Review of Systems  Constitutional: Negative.   Respiratory: Negative.    Cardiovascular: Negative.   Musculoskeletal: Negative.   Neurological: Negative.   Psychiatric/Behavioral: Negative.      Per HPI unless specifically indicated above     Objective:    BP 120/77   Pulse 76   Temp (!) 97.3 F (36.3 C) (Oral)   Ht 5' 5 (1.651 m)   Wt 256 lb 3.2 oz (116.2 kg)   LMP 04/09/2016   SpO2 97%   BMI 42.63 kg/m   Wt Readings from Last 3 Encounters:  07/19/24 256 lb 3.2 oz (116.2 kg)  04/16/24 264 lb 12.8 oz (120.1 kg)  12/16/23 278 lb (126.1 kg)    Physical Exam Vitals and nursing note reviewed.  Constitutional:      General: She is not in acute distress.    Appearance: Normal appearance. She is not ill-appearing, toxic-appearing or diaphoretic.  HENT:     Head: Normocephalic and atraumatic.     Right Ear: External ear normal.     Left Ear: External ear normal.      Nose: Nose normal.     Mouth/Throat:     Mouth: Mucous membranes are moist.     Pharynx: Oropharynx is clear.  Eyes:     General: No scleral icterus.       Right eye: No discharge.        Left eye: No discharge.     Extraocular Movements: Extraocular movements intact.     Conjunctiva/sclera: Conjunctivae normal.     Pupils: Pupils are equal, round, and reactive to light.  Cardiovascular:     Rate and Rhythm: Normal rate and regular rhythm.     Pulses: Normal pulses.     Heart sounds: Normal heart sounds. No murmur heard.    No friction rub. No gallop.  Pulmonary:     Effort: Pulmonary effort is normal. No respiratory distress.     Breath sounds: Normal breath sounds. No stridor. No wheezing, rhonchi or rales.  Chest:     Chest wall: No tenderness.  Musculoskeletal:        General: Normal range of motion.     Cervical back: Normal range of motion and neck supple.  Skin:    General: Skin is warm and dry.     Capillary Refill: Capillary refill takes less than 2 seconds.     Coloration: Skin  is not jaundiced or pale.     Findings: No bruising, erythema, lesion or rash.  Neurological:     General: No focal deficit present.     Mental Status: She is alert and oriented to person, place, and time. Mental status is at baseline.  Psychiatric:        Mood and Affect: Mood normal.        Behavior: Behavior normal.        Thought Content: Thought content normal.        Judgment: Judgment normal.     Results for orders placed or performed in visit on 04/16/24  Bayer DCA Hb A1c Waived   Collection Time: 04/16/24  8:38 AM  Result Value Ref Range   HB A1C (BAYER DCA - WAIVED) 5.5 4.8 - 5.6 %  CBC with Differential/Platelet   Collection Time: 04/16/24  8:39 AM  Result Value Ref Range   WBC 5.6 3.4 - 10.8 x10E3/uL   RBC 5.07 3.77 - 5.28 x10E6/uL   Hemoglobin 13.5 11.1 - 15.9 g/dL   Hematocrit 56.9 65.9 - 46.6 %   MCV 85 79 - 97 fL   MCH 26.6 26.6 - 33.0 pg   MCHC 31.4 (L) 31.5 -  35.7 g/dL   RDW 87.2 88.2 - 84.5 %   Platelets 290 150 - 450 x10E3/uL   Neutrophils 54 Not Estab. %   Lymphs 34 Not Estab. %   Monocytes 6 Not Estab. %   Eos 5 Not Estab. %   Basos 1 Not Estab. %   Neutrophils Absolute 3.0 1.4 - 7.0 x10E3/uL   Lymphocytes Absolute 1.9 0.7 - 3.1 x10E3/uL   Monocytes Absolute 0.4 0.1 - 0.9 x10E3/uL   EOS (ABSOLUTE) 0.3 0.0 - 0.4 x10E3/uL   Basophils Absolute 0.0 0.0 - 0.2 x10E3/uL   Immature Granulocytes 0 Not Estab. %   Immature Grans (Abs) 0.0 0.0 - 0.1 x10E3/uL  Comprehensive metabolic panel with GFR   Collection Time: 04/16/24  8:39 AM  Result Value Ref Range   Glucose 77 70 - 99 mg/dL   BUN 15 6 - 24 mg/dL   Creatinine, Ser 8.94 (H) 0.57 - 1.00 mg/dL   eGFR 64 >40 fO/fpw/8.26   BUN/Creatinine Ratio 14 9 - 23   Sodium 140 134 - 144 mmol/L   Potassium 4.5 3.5 - 5.2 mmol/L   Chloride 102 96 - 106 mmol/L   CO2 24 20 - 29 mmol/L   Calcium  9.6 8.7 - 10.2 mg/dL   Total Protein 7.1 6.0 - 8.5 g/dL   Albumin 4.5 3.8 - 4.9 g/dL   Globulin, Total 2.6 1.5 - 4.5 g/dL   Bilirubin Total 0.5 0.0 - 1.2 mg/dL   Alkaline Phosphatase 89 49 - 135 IU/L   AST 21 0 - 40 IU/L   ALT 14 0 - 32 IU/L  Lipid Panel w/o Chol/HDL Ratio   Collection Time: 04/16/24  8:39 AM  Result Value Ref Range   Cholesterol, Total 146 100 - 199 mg/dL   Triglycerides 63 0 - 149 mg/dL   HDL 57 >60 mg/dL   VLDL Cholesterol Cal 13 5 - 40 mg/dL   LDL Chol Calc (NIH) 76 0 - 99 mg/dL  Hepatitis B surface antibody,quantitative   Collection Time: 04/16/24  8:39 AM  Result Value Ref Range   Hepatitis B Surf Ab Quant <3.5 (L) Immunity>10 mIU/mL      Assessment & Plan:   Problem List Items Addressed This Visit  Other   Morbid obesity (HCC) - Primary   Congratulated patient on 50lb weight loss! Tolerating medicine well. Will increase her zepbound  to 12.5mg  and recheck in 3 months. Call with any concerns.       Relevant Medications   tirzepatide  (ZEPBOUND ) 12.5 MG/0.5ML Pen      Follow up plan: Return in about 3 months (around 10/17/2024) for physical.      "

## 2024-07-19 NOTE — Assessment & Plan Note (Signed)
 Congratulated patient on 50lb weight loss! Tolerating medicine well. Will increase her zepbound  to 12.5mg  and recheck in 3 months. Call with any concerns.

## 2024-07-22 ENCOUNTER — Encounter: Payer: Self-pay | Admitting: Family Medicine

## 2024-10-18 ENCOUNTER — Encounter: Admitting: Family Medicine
